# Patient Record
Sex: Female | Born: 1960
Health system: Southern US, Community
[De-identification: ages and names within clinical notes are randomized; demographics above are authoritative.]

## PROBLEM LIST (undated history)

## (undated) DIAGNOSIS — A609 Anogenital herpesviral infection, unspecified: Secondary | ICD-10-CM

## (undated) DIAGNOSIS — M8580 Other specified disorders of bone density and structure, unspecified site: Secondary | ICD-10-CM

## (undated) HISTORY — DX: Other specified disorders of bone density and structure, unspecified site: M85.80

## (undated) HISTORY — PX: TONSILLECTOMY AND ADENOIDECTOMY: SUR1326

## (undated) HISTORY — DX: Anogenital herpesviral infection, unspecified: A60.9

---

## 1999-01-05 ENCOUNTER — Other Ambulatory Visit: Admission: RE | Admit: 1999-01-05 | Discharge: 1999-01-05 | Payer: Self-pay | Admitting: Gynecology

## 2000-06-08 ENCOUNTER — Other Ambulatory Visit: Admission: RE | Admit: 2000-06-08 | Discharge: 2000-06-08 | Payer: Self-pay | Admitting: Gynecology

## 2001-05-30 ENCOUNTER — Encounter: Payer: Self-pay | Admitting: Surgery

## 2001-05-30 ENCOUNTER — Encounter: Admission: RE | Admit: 2001-05-30 | Discharge: 2001-05-30 | Payer: Self-pay | Admitting: Surgery

## 2001-08-18 ENCOUNTER — Other Ambulatory Visit: Admission: RE | Admit: 2001-08-18 | Discharge: 2001-08-18 | Payer: Self-pay | Admitting: Gynecology

## 2003-03-16 ENCOUNTER — Ambulatory Visit (HOSPITAL_COMMUNITY): Admission: RE | Admit: 2003-03-16 | Discharge: 2003-03-16 | Payer: Self-pay | Admitting: Neurology

## 2003-03-17 ENCOUNTER — Encounter: Payer: Self-pay | Admitting: Neurology

## 2003-03-17 ENCOUNTER — Ambulatory Visit (HOSPITAL_COMMUNITY): Admission: RE | Admit: 2003-03-17 | Discharge: 2003-03-17 | Payer: Self-pay | Admitting: Neurology

## 2003-06-06 ENCOUNTER — Encounter: Payer: Self-pay | Admitting: Neurology

## 2003-06-06 ENCOUNTER — Ambulatory Visit (HOSPITAL_COMMUNITY): Admission: RE | Admit: 2003-06-06 | Discharge: 2003-06-06 | Payer: Self-pay | Admitting: Neurology

## 2004-08-18 ENCOUNTER — Other Ambulatory Visit: Admission: RE | Admit: 2004-08-18 | Discharge: 2004-08-18 | Payer: Self-pay | Admitting: Gynecology

## 2005-09-08 ENCOUNTER — Other Ambulatory Visit: Admission: RE | Admit: 2005-09-08 | Discharge: 2005-09-08 | Payer: Self-pay | Admitting: Gynecology

## 2007-09-20 ENCOUNTER — Other Ambulatory Visit: Admission: RE | Admit: 2007-09-20 | Discharge: 2007-09-20 | Payer: Self-pay | Admitting: Gynecology

## 2008-12-10 ENCOUNTER — Ambulatory Visit: Payer: Self-pay | Admitting: Vascular Surgery

## 2012-09-15 ENCOUNTER — Encounter: Payer: Self-pay | Admitting: Gynecology

## 2012-09-25 ENCOUNTER — Encounter: Payer: Self-pay | Admitting: Gynecology

## 2012-09-25 ENCOUNTER — Other Ambulatory Visit (HOSPITAL_COMMUNITY)
Admission: RE | Admit: 2012-09-25 | Discharge: 2012-09-25 | Disposition: A | Discharge status: Home / Self Care | Payer: 59 | Source: Ambulatory Visit | Attending: Gynecology | Admitting: Gynecology

## 2012-09-25 ENCOUNTER — Ambulatory Visit (INDEPENDENT_AMBULATORY_CARE_PROVIDER_SITE_OTHER): Payer: 59 | Admitting: Gynecology

## 2012-09-25 VITALS — BP 112/76 | Ht 69.0 in | Wt 160.0 lb

## 2012-09-25 DIAGNOSIS — Z13220 Encounter for screening for lipoid disorders: Secondary | ICD-10-CM

## 2012-09-25 DIAGNOSIS — N926 Irregular menstruation, unspecified: Secondary | ICD-10-CM

## 2012-09-25 DIAGNOSIS — Z1151 Encounter for screening for human papillomavirus (HPV): Secondary | ICD-10-CM | POA: Insufficient documentation

## 2012-09-25 DIAGNOSIS — Z01419 Encounter for gynecological examination (general) (routine) without abnormal findings: Secondary | ICD-10-CM | POA: Insufficient documentation

## 2012-09-25 NOTE — Patient Instructions (Signed)
  Schedule colonoscopy with Corinda Gubler or [facility]  Call to Schedule your mammogram  Facilities in [place]: 1)  [facility] [place], [place] Phone: [phone] 2)  [facility]. [facility], [phone] Phone: [phone] 3)  Dr. Yolanda Bonine at [facility] [address] Phone: [phone]     Mammogram A mammogram is an X-ray test to find changes in a woman's breast. You should get a mammogram if:  You are 53 years of age or older  You have risk factors.   Your doctor recommends that you have one.  BEFORE THE TEST  Do not schedule the test the week before your period, especially if your breasts are sore during this time.  On the day of your mammogram:  Wash your breasts and armpits well. After washing, do not put on any deodorant or talcum powder on until after your test.   Eat and drink as you usually do.   Take your medicines as usual.   If you are diabetic and take insulin, make sure you:   Eat before coming for your test.   Take your insulin as usual.   If you cannot keep your appointment, call before the appointment to cancel. Schedule another appointment.  TEST  You will need to undress from the waist up. You will put on a hospital gown.   Your breast will be put on the mammogram machine, and it will press firmly on your breast with a piece of plastic called a compression paddle. This will make your breast flatter so that the machine can X-ray all parts of your breast.   Both breasts will be X-rayed. Each breast will be X-rayed from above and from the side. An X-ray might need to be taken again if the picture is not good enough.   The mammogram will last about 15 to 30 minutes.  AFTER THE TEST Finding out the results of your test Ask when your test results will be ready. Make sure you get your test results.  Document Released: 10/29/2008 Document Revised:  07/22/2011 Document Reviewed: 10/29/2008 [facility] Patient Information 2012 [place], [place].

## 2012-09-25 NOTE — Progress Notes (Signed)
.   Donna Kaufman [birth year] [id number]        52 y.o.  [id number] for annual exam.  Has not been in since 2009. Several issues noted below.  Past medical history,surgical history, medications, allergies, family history and social history were all reviewed and documented in the EPIC chart. ROS:  Was performed and pertinent positives and negatives are included in the history.  Exam: Kim assistant Filed Vitals:   09/25/12 1427  BP: 112/76  Height: 5\' 9"  (1.753 m)  Weight: 160 lb (72.576 kg)   General appearance  Normal Skin grossly normal Head/Neck normal with no cervical or supraclavicular adenopathy thyroid normal Lungs  clear Cardiac RR, without RMG Abdominal  soft, nontender, without masses, organomegaly or hernia Breasts  examined lying and sitting without masses, retractions, discharge or axillary adenopathy. Pelvic  Ext/BUS/vagina  normal   Cervix  normal Pap/HPV  Uterus  anteverted, normal size, shape and contour, midline and mobile nontender   Adnexa  Without masses or tenderness    Anus and perineum  normal   Rectovaginal  normal sphincter tone without palpated masses or tenderness.    Assessment/Plan:  52 y.o. [name] female for annual exam, vasectomy birth control.   1. Irregular menses/menopausal symptoms. Patient having some slight hot flashes and night sweats. Also notes irregular menses with LMP 6 months ago. Those of the past year says her become less frequent. No prolonged or atypical bleeding. Feels that her menopausal symptoms are tolerable and does not want to consider treatment. I did review OTC soy. HRT was discussed to include women's health initiative increased risk of stroke heart attack DVT of breast cancer. Patient would prefer just monitoring at present will follow up with me if significant symptoms, goes more than one year without bleeding and then bleeds or as prolonged or atypical bleeding. Otherwise will follow up in one year. 2. Pap smear 2009. Pap/HPV  today. No history of significant abnormal Pap smears.  Assuming this Pap smear normal plan less frequent screening every 3-5 years. 3. Mammography 2009. Patient knows she's overdue and plans to schedule. Names and facilities provided. SBE monthly reviewed. 4. Colonoscopy. Patient's never had a colonoscopy in knows the need to schedule. I gave her the names of several facilities and she will call to arrange. 5. DEXA. We'll plan further into menopause. Increase calcium vitamin D reviewed. 6. Health maintenance. Baseline CBC comprehensive metabolic panel lipid profile TSH vitamin D ordered as a future orders as she will come back fasting at her choice. Follow up one year, sooner as needed.    Dara Lords MD, 3:24 PM 09/25/2012

## 2012-09-26 ENCOUNTER — Other Ambulatory Visit: Payer: 59

## 2012-09-26 LAB — URINALYSIS W MICROSCOPIC + REFLEX CULTURE
Bilirubin Urine: NEGATIVE
Casts: NONE SEEN
Glucose, UA: NEGATIVE mg/dL
Hgb urine dipstick: NEGATIVE
Ketones, ur: NEGATIVE mg/dL
Leukocytes, UA: NEGATIVE
Protein, ur: NEGATIVE mg/dL
pH: 6 (ref 5.0–8.0)

## 2012-10-06 ENCOUNTER — Encounter: Payer: Self-pay | Admitting: Gynecology

## 2012-11-02 ENCOUNTER — Other Ambulatory Visit: Payer: 59

## 2012-11-02 DIAGNOSIS — Z01419 Encounter for gynecological examination (general) (routine) without abnormal findings: Secondary | ICD-10-CM

## 2012-11-02 DIAGNOSIS — Z13220 Encounter for screening for lipoid disorders: Secondary | ICD-10-CM

## 2012-11-02 LAB — COMPREHENSIVE METABOLIC PANEL
AST: 21 U/L (ref 0–37)
Albumin: 4.4 g/dL (ref 3.5–5.2)
Alkaline Phosphatase: 52 U/L (ref 39–117)
Glucose, Bld: 88 mg/dL (ref 70–99)
Potassium: 3.8 mEq/L (ref 3.5–5.3)
Sodium: 140 mEq/L (ref 135–145)
Total Bilirubin: 0.5 mg/dL (ref 0.3–1.2)
Total Protein: 6.4 g/dL (ref 6.0–8.3)

## 2012-11-02 LAB — LIPID PANEL
HDL: 61 mg/dL (ref >39)
LDL Cholesterol: 95 mg/dL (ref 0–99)
Total CHOL/HDL Ratio: 2.7 Ratio
Triglycerides: 45 mg/dL (ref <150)
VLDL: 9 mg/dL (ref 0–40)

## 2012-11-02 LAB — CBC WITH DIFFERENTIAL/PLATELET
Basophils Relative: 1 % (ref 0–1)
Eosinophils Absolute: 0.1 10*3/uL (ref 0.0–0.7)
Eosinophils Relative: 1 % (ref 0–5)
Hemoglobin: 14.3 g/dL (ref 12.0–15.0)
Lymphs Abs: 0.8 10*3/uL (ref 0.7–4.0)
MCH: 29.4 pg (ref 26.0–34.0)
MCHC: 34.9 g/dL (ref 30.0–36.0)
MCV: 84.2 fL (ref 78.0–100.0)
Monocytes Relative: 9 % (ref 3–12)
RBC: 4.87 MIL/uL (ref 3.87–5.11)

## 2013-01-31 ENCOUNTER — Encounter: Payer: Self-pay | Admitting: Gynecology

## 2013-01-31 ENCOUNTER — Ambulatory Visit (INDEPENDENT_AMBULATORY_CARE_PROVIDER_SITE_OTHER): Payer: 59 | Admitting: Gynecology

## 2013-01-31 DIAGNOSIS — B9689 Other specified bacterial agents as the cause of diseases classified elsewhere: Secondary | ICD-10-CM

## 2013-01-31 DIAGNOSIS — N760 Acute vaginitis: Secondary | ICD-10-CM

## 2013-01-31 DIAGNOSIS — N390 Urinary tract infection, site not specified: Secondary | ICD-10-CM

## 2013-01-31 DIAGNOSIS — N898 Other specified noninflammatory disorders of vagina: Secondary | ICD-10-CM

## 2013-01-31 DIAGNOSIS — A499 Bacterial infection, unspecified: Secondary | ICD-10-CM

## 2013-01-31 LAB — URINALYSIS W MICROSCOPIC + REFLEX CULTURE
Bacteria, UA: NONE SEEN
Casts: NONE SEEN
Glucose, UA: NEGATIVE mg/dL
Hgb urine dipstick: NEGATIVE
Ketones, ur: 15 mg/dL — AB
Leukocytes, UA: NEGATIVE
Protein, ur: NEGATIVE mg/dL
RBC / HPF: NONE SEEN RBC/hpf (ref <3)
WBC, UA: NONE SEEN WBC/hpf (ref <3)

## 2013-01-31 LAB — WET PREP FOR TRICH, YEAST, CLUE
Trich, Wet Prep: NONE SEEN
Yeast Wet Prep HPF POC: NONE SEEN

## 2013-01-31 MED ORDER — NITROFURANTOIN MONOHYD MACRO 100 MG PO CAPS: 100.0000 mg | ORAL_CAPSULE | Freq: Once | ORAL | Status: DC

## 2013-01-31 MED ORDER — METRONIDAZOLE 500 MG PO TABS: 500.0000 mg | ORAL_TABLET | Freq: Two times a day (BID) | ORAL | Status: DC

## 2013-01-31 NOTE — Progress Notes (Signed)
Patient presents complaining of one week of vaginal discharge with some irritation. She also notes frequent UTIs following intercourse. Currently not having any symptoms.  Exam with Kim assistant Abdomen soft nontender without masses guarding rebound organomegaly. External BUS vagina with white discharge. Cervix normal. Uterus normal size midline mobile nontender. Adnexa without masses or tenderness.  Assessment and plan: 1. Bacterial vaginosis. Symptoms exam and wet prep consistent with bacterial vaginosis. Treat with Flagyl 500 mg twice a day x7 days, alcohol avoidance reviewed. 2. Frequent post coital UTIs. Will cover with Macrobid 100 mg with intercourse #30 refill x1. Followup if symptoms persist. Check baseline UA today.

## 2013-01-31 NOTE — Patient Instructions (Signed)
Take Flagyl medication twice daily for 7 days, avoid alcohol while taking. Take Macrodantin antibiotic one pill with intercourse to help prevent urinary tract infections.

## 2013-02-17 ENCOUNTER — Telehealth: Payer: Self-pay | Admitting: Gynecology

## 2013-02-17 MED ORDER — CLINDAMYCIN PHOSPHATE 2 % VA CREA: 1.0000 | TOPICAL_CREAM | Freq: Every day | VAGINAL | Status: DC

## 2013-02-17 NOTE — Telephone Encounter (Signed)
On Call note: Evaluated 01/31/2013 for BV.  Used Flagyl but discharge has continued with odor.  No pruritis.  Does note fever to 101 yesterday.  No other symptoms such as UTI, viral URI no other family members sick.  Recommend cleocin vag cream.  If fever continues recommend office visit for evaluation.

## 2013-02-25 ENCOUNTER — Encounter (HOSPITAL_COMMUNITY): Payer: Self-pay | Admitting: Emergency Medicine

## 2013-02-25 ENCOUNTER — Emergency Department (HOSPITAL_COMMUNITY)
Admission: EM | Admit: 2013-02-25 | Discharge: 2013-02-26 | Disposition: A | Discharge status: Home / Self Care | Payer: 59 | Attending: Emergency Medicine | Admitting: Emergency Medicine

## 2013-02-25 ENCOUNTER — Emergency Department (HOSPITAL_COMMUNITY): Payer: 59

## 2013-02-25 DIAGNOSIS — R5381 Other malaise: Secondary | ICD-10-CM | POA: Insufficient documentation

## 2013-02-25 DIAGNOSIS — N898 Other specified noninflammatory disorders of vagina: Secondary | ICD-10-CM | POA: Insufficient documentation

## 2013-02-25 DIAGNOSIS — R6883 Chills (without fever): Secondary | ICD-10-CM | POA: Insufficient documentation

## 2013-02-25 DIAGNOSIS — R059 Cough, unspecified: Secondary | ICD-10-CM | POA: Insufficient documentation

## 2013-02-25 DIAGNOSIS — I959 Hypotension, unspecified: Secondary | ICD-10-CM | POA: Insufficient documentation

## 2013-02-25 DIAGNOSIS — IMO0002 Reserved for concepts with insufficient information to code with codable children: Secondary | ICD-10-CM | POA: Insufficient documentation

## 2013-02-25 DIAGNOSIS — R112 Nausea with vomiting, unspecified: Secondary | ICD-10-CM | POA: Insufficient documentation

## 2013-02-25 DIAGNOSIS — M545 Low back pain, unspecified: Secondary | ICD-10-CM | POA: Insufficient documentation

## 2013-02-25 DIAGNOSIS — R42 Dizziness and giddiness: Secondary | ICD-10-CM

## 2013-02-25 DIAGNOSIS — R51 Headache: Secondary | ICD-10-CM | POA: Insufficient documentation

## 2013-02-25 DIAGNOSIS — R05 Cough: Secondary | ICD-10-CM | POA: Insufficient documentation

## 2013-02-25 DIAGNOSIS — E860 Dehydration: Secondary | ICD-10-CM

## 2013-02-25 DIAGNOSIS — R21 Rash and other nonspecific skin eruption: Secondary | ICD-10-CM | POA: Insufficient documentation

## 2013-02-25 LAB — CBC WITH DIFFERENTIAL/PLATELET
Eosinophils Absolute: 0 10*3/uL (ref 0.0–0.7)
Eosinophils Relative: 0 % (ref 0–5)
Hemoglobin: 14 g/dL (ref 12.0–15.0)
Lymphocytes Relative: 1 % — ABNORMAL LOW (ref 12–46)
Lymphs Abs: 0.1 10*3/uL — ABNORMAL LOW (ref 0.7–4.0)
MCH: 29.8 pg (ref 26.0–34.0)
MCV: 88.3 fL (ref 78.0–100.0)
Monocytes Relative: 1 % — ABNORMAL LOW (ref 3–12)
Neutrophils Relative %: 99 % — ABNORMAL HIGH (ref 43–77)
Platelets: 203 10*3/uL (ref 150–400)
RBC: 4.7 MIL/uL (ref 3.87–5.11)
WBC: 8.3 10*3/uL (ref 4.0–10.5)

## 2013-02-25 MED ORDER — SODIUM CHLORIDE 0.9 % IV BOLUS (SEPSIS)
1000.0000 mL | Freq: Once | INTRAVENOUS | Status: AC
Start: 2013-02-25 — End: 2013-02-26
  Administered 2013-02-25: 1000 mL via INTRAVENOUS

## 2013-02-25 MED ORDER — ONDANSETRON HCL 4 MG/2ML IJ SOLN
4.0000 mg | Freq: Once | INTRAMUSCULAR | Status: AC
  Administered 2013-02-25: 4 mg via INTRAVENOUS
  Filled 2013-02-25: qty 2

## 2013-02-25 MED ORDER — LACTATED RINGERS IV BOLUS (SEPSIS)
2000.0000 mL | Freq: Once | INTRAVENOUS | Status: AC
  Administered 2013-02-26: 2000 mL via INTRAVENOUS

## 2013-02-25 NOTE — ED Notes (Signed)
Patient reports that about a week ago she started to feel chilled and having dizziness with vomiting. The patient also reports that she has aches in her back adn neck. The patient reports that it went away and now it is back. The patient is pale

## 2013-02-25 NOTE — ED Provider Notes (Signed)
History    CSN: [id number] Arrival date & time 02/25/13  2114  First MD Initiated Contact with Patient 02/25/13 2304     Chief Complaint  Patient presents with  . Dizziness  . Chills   HPI Donna Kaufman is a 52 y.o. female presents with dizziness, nausea vomiting does have some associated back pain today. Patient says she was driving home from a vacation house in [place] at about 1800 she developed a backache started at the lower back on both sides, not in the midline, she felt nauseous, took some ibuprofen but vomited it up. It had a couple more episodes of vomiting. She also got a dull headache which she says felt like she was dehydrated in the past.   Patient was recently in [place] had a vacation home for a family reunion, [place], and says she did go swimming there on Thursday or Friday and also probably some urged her head.  1011 days ago, she had some similar symptoms while at the [facility] of generalized malaise, chills, subjective fever, shaking chills and fatigue. The symptoms did resolve the course of the week.  Patient does not smoke, she says she's been drinking during her vacation including vodka, wine and margaritas.  Patient complains about bilateral knee pain which is been worse going upstairs, does not hurt with extended periods of sitting, does not hurt currently. Patient also complains about a week ago she noticed a rash on her lower extremities this was nonpruritic, she's not sure that was blanching, it did not seem raised she did not touch it.  Currently the patient is complaining about a dull headache, she says she feels thirsty but her other symptoms of dizziness, malaise, back pain and neck pain are gone.  Also on review of systems patient complains about vaginal discharge and odor, 2 weeks ago she complained about this to primary care doctor, was given course of antibiotics that she was not to take with alcohol, she completed it on or about 6/21, symptoms  persisted, she used a topical antibiotic preparation which cleared up her symptoms. Patient also noticed that last Friday she had a cough for a few days but that went away. She also says that she's got a history of minor low back issues as well some chronic neck issues. No injuries and no surgical history to back or neck.  History reviewed. No pertinent past medical history. Past Surgical History  Procedure Laterality Date  . Cesarean section    . Tonsillectomy and adenoidectomy     Family History  Problem Relation Age of Onset  . Hypertension Father   . Diabetes Father   . Cancer Father     Prostate  . Cancer Brother     Prostate   History  Substance Use Topics  . Smoking status: Never Smoker   . Smokeless tobacco: Not on file  . Alcohol Use: 2.0 oz/week    4 drink(s) per week   OB History   Grav Para Term Preterm Abortions TAB SAB Ect Mult Living   3 2 2  1  1   2      Review of Systems At least 10pt or greater review of systems completed and are negative except where specified in the HPI.  Allergies  Review of patient's allergies indicates no known allergies.  Home Medications   Current Outpatient Rx  Name  Route  Sig  Dispense  Refill  . clindamycin (CLEOCIN) 2 % vaginal cream   Vaginal  Place 1 Applicatorful vaginally at bedtime.   40 g   0   . Fexofenadine HCl (ALLEGRA PO)   Oral   Take by mouth.         . fluticasone (VERAMYST) 27.5 MCG/SPRAY nasal spray   Nasal   Place 2 sprays into the nose daily.         . metroNIDAZOLE (FLAGYL) 500 MG tablet   Oral   Take 1 tablet (500 mg total) by mouth 2 (two) times daily. For 7 days.  Avoid alcohol while taking   14 tablet   0   . nitrofurantoin, macrocrystal-monohydrate, (MACROBID) 100 MG capsule   Oral   Take 1 capsule (100 mg total) by mouth once. After intercourse   30 capsule   1    BP 95/37  Pulse 81  Temp(Src) 99.2 F (37.3 C) (Oral)  Resp 15  Ht 5\' 9"  (1.753 m)  Wt 140 lb (63.504 kg)   BMI 20.67 kg/m2  SpO2 100% Physical Exam  PHYSICAL EXAM: VITAL SIGNS:  . Filed Vitals:   02/25/13 2225 02/25/13 2234 02/26/13 0019  BP: 74/42 95/37   Pulse: 81 81   Temp: 99.3 F (37.4 C) 99.2 F (37.3 C) 99.3 F (37.4 C)  TempSrc: Oral Oral Rectal  Resp: 15    Height: 5\' 9"  (1.753 m)    Weight: 140 lb (63.504 kg)    SpO2: 99% 100%    CONSTITUTIONAL: Awake, oriented x4, appears non-toxic HENT: Atraumatic, normocephalic, oral mucosa pink and moist, airway patent. Nares patent without drainage. External ears normal. EYES: Conjunctiva clear, EOMI, PERRLA NECK: Trachea midline, non-tender, supple. Patient able to rotate head and neck, fully flex and extend head and neck without any pain or discomfort. CARDIOVASCULAR: Normal heart rate, Normal rhythm, No murmurs, rubs, gallops PULMONARY/CHEST: Fine crackles in the base of the right lung on expiration, no wheezes or rhonchi. Symmetrical breath sounds. CHEST WALL: No lesions. Non-tender. ABDOMINAL: Non-distended, soft, non-tender - no rebound or guarding.  BS normal. NEUROLOGIC: ZO:XWRUEA fields intact. PERRLA, EOMI.  Facial sensation equal to light touch bilaterally.  Good muscle bulk in the masseter muscle and good lateral movement of the jaw.  Facial expressions equal and good strength with smile/frown and puffed cheeks.  Hearing grossly intact to finger rub test.  Uvula, tongue are midline with no deviation. Symmetrical palate elevation.  Trapezius and SCM muscles are 5/5 strength bilaterally.   DTR: Brachioradialis, biceps, patellar, Achilles tendon reflexes 2+ bilaterally.  No clonus. Strength: 5/5 strength flexors and extensors in the upper and lower extremities.  Grip strength, finger adduction/abduction 5/5. Sensation: Sensation intact distally to light touch Cerebellar: No ataxia with walking or dysmetria with finger to nose, rapid alternating hand movements and heels to shin testing. EXTREMITIES: No clubbing, cyanosis, or  edema SKIN: Warm, Dry, No erythema, patches of mildly erythematous, blanchable skin on the lower extremities, these are not pruritic, no excoriations, no drainage.   ED Course  Procedures (including critical care time) Labs Reviewed  COMPREHENSIVE METABOLIC PANEL - Abnormal; Notable for the following:    Glucose, Bld 103 (*)    AST 154 (*)    ALT 80 (*)    All other components within normal limits  CBC WITH DIFFERENTIAL - Abnormal; Notable for the following:    Neutrophils Relative % 99 (*)    Neutro Abs 8.2 (*)    Lymphocytes Relative 1 (*)    Lymphs Abs 0.1 (*)    Monocytes Relative 1 (*)  All other components within normal limits  URINALYSIS, ROUTINE W REFLEX MICROSCOPIC - Abnormal; Notable for the following:    Color, Urine AMBER (*)    APPearance CLOUDY (*)    Bilirubin Urine SMALL (*)    Ketones, ur >80 (*)    Leukocytes, UA MODERATE (*)    All other components within normal limits  POCT I-STAT, CHEM 8 - Abnormal; Notable for the following:    Glucose, Bld 112 (*)    All other components within normal limits  URINE CULTURE  URINE MICROSCOPIC-ADD ON   Dg Chest 2 View  02/26/2013   *RADIOLOGY REPORT*  Clinical Data: Cough, fever and crackles; hypotension.  CHEST - 2 VIEW  Comparison: None.  Findings: The lungs are well-aerated and clear.  There is no evidence of focal opacification, pleural effusion or pneumothorax.  The heart is normal in size; the mediastinal contour is within normal limits.  No acute osseous abnormalities are seen.  IMPRESSION: No acute cardiopulmonary process seen.   Original Report Authenticated By: Tonia Ghent, M.D.   1. Moderate dehydration   2. Dizzy   3. Hypotension   4. Headache    Medications  ondansetron (ZOFRAN) injection 4 mg (4 mg Intravenous Given 02/25/13 2257)  sodium chloride 0.9 % bolus 1,000 mL (0 mLs Intravenous Stopped 02/26/13 0009)  lactated ringers bolus 2,000 mL (0 mLs Intravenous Stopped 02/26/13 0203)    MDM  Donna Kaufman is a 52 y.o. female presenting with nausea, chills, dizziness and vomiting. Patient has been vacationing and according to her, doing a bit more drinking and usual, she's been orthostatic by history, patient does typically have low blood pressure, however blood pressure is a bit lower than normal today. Based on the patient's clinical history, physical exam, she is nontoxic, she is afebrile, rectal temperature is unremarkable, I do not think she's got a serious infection at this time. She has been vacationing and a leg, she did submerge her head, I do not think she's got meningitis or encephalitis-did consider infection with Naeglaria etc, but the patient's benign presentation makes this less likely. Labs are significant for some dehydration, she's been given fluids, and feels much better. She is not nauseous anymore, she is not orthostatic, I think she is likely been dehydrated secondary to increased alcohol intake, at this point I do not think lumbar puncture is indicated. Do not think this patient needs antibiotics- patient has no symptoms of urinary tract infection, urine is concentrated, contaminated with squamous epithelial cells, will be cultured for followup. Increase of AST and ALT and about 21 ratio suggestive of alcohol intake, abdomen is benign on exam, do not suspect surgical emergency, do not think imaging is indicated.  Treat patient symptomatically, patient to followup with primary care. I explained the diagnosis and have given explicit precautions to return to the ER including any other new or worsening symptoms. The patient understands and accepts the medical plan as it's been dictated and I have answered their questions. Discharge instructions concerning home care and prescriptions have been given.  The patient is STABLE and is discharged to home in good condition.    Jones Skene, MD 03/01/13 2245

## 2013-02-26 LAB — URINALYSIS, ROUTINE W REFLEX MICROSCOPIC
Glucose, UA: NEGATIVE mg/dL
Hgb urine dipstick: NEGATIVE
Protein, ur: NEGATIVE mg/dL
pH: 6 (ref 5.0–8.0)

## 2013-02-26 LAB — COMPREHENSIVE METABOLIC PANEL
AST: 154 U/L — ABNORMAL HIGH (ref 0–37)
Albumin: 3.7 g/dL (ref 3.5–5.2)
BUN: 20 mg/dL (ref 6–23)
Calcium: 9.5 mg/dL (ref 8.4–10.5)
Creatinine, Ser: 0.77 mg/dL (ref 0.50–1.10)
Total Protein: 6.4 g/dL (ref 6.0–8.3)

## 2013-02-26 LAB — POCT I-STAT, CHEM 8
BUN: 18 mg/dL (ref 6–23)
Chloride: 103 mEq/L (ref 96–112)
Creatinine, Ser: 0.9 mg/dL (ref 0.50–1.10)
Glucose, Bld: 112 mg/dL — ABNORMAL HIGH (ref 70–99)
Potassium: 3.6 mEq/L (ref 3.5–5.1)
Sodium: 141 mEq/L (ref 135–145)

## 2013-02-26 LAB — URINE MICROSCOPIC-ADD ON

## 2013-02-26 MED ORDER — ONDANSETRON 4 MG PO TBDP: 4.0000 mg | ORAL_TABLET | Freq: Four times a day (QID) | ORAL | Status: DC | PRN

## 2013-02-26 NOTE — ED Notes (Signed)
EDP aware of hypotension, no new orders at this time.  MD will reassess pt

## 2013-02-27 LAB — URINE CULTURE

## 2013-04-10 ENCOUNTER — Other Ambulatory Visit: Payer: Self-pay | Admitting: Family Medicine

## 2013-04-13 ENCOUNTER — Other Ambulatory Visit: Payer: Self-pay | Admitting: Family Medicine

## 2013-04-13 ENCOUNTER — Ambulatory Visit
Admission: RE | Admit: 2013-04-13 | Discharge: 2013-04-13 | Disposition: A | Discharge status: Home / Self Care | Payer: 59 | Source: Ambulatory Visit | Attending: Family Medicine | Admitting: Family Medicine

## 2013-05-14 ENCOUNTER — Telehealth: Payer: Self-pay | Admitting: *Deleted

## 2013-05-14 NOTE — Telephone Encounter (Signed)
Sounds more of a side effect/reaction than a true allergy such as rash or respiratory symptoms. Regardless I would stop Macrobid and switched to ciprofloxacin 250 mg 1 pill with intercourse to see if this doesn't prevent recurrent UTIs. #30 no refill

## 2013-05-14 NOTE — Telephone Encounter (Signed)
Pt was given Rx for Macrobid 100 mg with intercourse on 01/31/13, pt believes she may be allergic to medication. Pt c/o aching, low grade fever, headaches only when taking medication. Please advise

## 2013-05-15 MED ORDER — CIPROFLOXACIN HCL 250 MG PO TABS: ORAL_TABLET | ORAL | Status: DC

## 2013-05-15 NOTE — Telephone Encounter (Signed)
Pt informed, rx sent 

## 2013-05-15 NOTE — Telephone Encounter (Signed)
Left message for pt to call.

## 2013-06-25 ENCOUNTER — Encounter: Payer: Self-pay | Admitting: Gynecology

## 2013-06-25 ENCOUNTER — Ambulatory Visit (INDEPENDENT_AMBULATORY_CARE_PROVIDER_SITE_OTHER): Payer: 59 | Admitting: Gynecology

## 2013-06-25 DIAGNOSIS — R300 Dysuria: Secondary | ICD-10-CM

## 2013-06-25 DIAGNOSIS — N390 Urinary tract infection, site not specified: Secondary | ICD-10-CM

## 2013-06-25 LAB — URINALYSIS W MICROSCOPIC + REFLEX CULTURE
Bilirubin Urine: NEGATIVE
Casts: NONE SEEN
Crystals: NONE SEEN
Glucose, UA: NEGATIVE mg/dL
Ketones, ur: NEGATIVE mg/dL
Specific Gravity, Urine: 1.02 (ref 1.005–1.030)
Urobilinogen, UA: 0.2 mg/dL (ref 0.0–1.0)
pH: 5.5 (ref 5.0–8.0)

## 2013-06-25 MED ORDER — CIPROFLOXACIN HCL 250 MG PO TABS: 250.0000 mg | ORAL_TABLET | Freq: Two times a day (BID) | ORAL | Status: DC

## 2013-06-25 NOTE — Progress Notes (Signed)
Several day history of painful urination. No significant frequency, urgency low back pain fever chills. Had been using Macrobid prophylactically postcoitally but had reaction to include fevers myalgias arthralgias. Had extensive workup to include Lyme's disease all of which was negative and was felt as a reaction to medication.  Exam Spine straight without CVA tenderness. Abdomen soft nontender without masses guarding rebound organomegaly. Urinalysis with TNTC wbc's many bacteria a few squamous cells.  Assessment and plan: UTI by symptoms and urinalysis. Treat with ciprofloxacin 250 mg twice a day x7 days. Possible post coital treatment with ciprofloxacin discussed but declined by the patient. She really preferred just to monitor at present and to see how frequent she has any recurrences. She would prefer to do a short treatment course if UTIs occur less frequently. Had been seen by urology in the past evaluation. She will call if her symptoms persist worsen or recur.

## 2013-06-25 NOTE — Patient Instructions (Signed)
Take ciprofloxacin antibiotic twice daily for 7 days. Call me if your symptoms persist, worsen or recur.

## 2013-09-03 ENCOUNTER — Telehealth: Payer: Self-pay | Admitting: *Deleted

## 2013-09-03 ENCOUNTER — Ambulatory Visit: Payer: 59 | Admitting: Gynecology

## 2013-09-03 MED ORDER — SULFAMETHOXAZOLE-TMP DS 800-160 MG PO TABS: 1.0000 | ORAL_TABLET | Freq: Two times a day (BID) | ORAL | Status: DC

## 2013-09-03 NOTE — Telephone Encounter (Signed)
Pt calling c/o UTI painful urination, last seen on 06/25/13 treated for UTI, pt said she was told if symptoms should occur again to call for Rx? Please advise

## 2013-09-03 NOTE — Telephone Encounter (Signed)
Pt informed with the below note, rx sent. 

## 2013-09-03 NOTE — Telephone Encounter (Signed)
Patient asked if she could stick to the same medication given last time Cipro 250 mg given back in Nov. Only because she is nervous about taking new medication due to Macrobid sickness last time. She also said that you told her she may need a Rx to keep on hand due to recurrent UTI? Please advise

## 2013-09-03 NOTE — Telephone Encounter (Signed)
The reason I pick a different antibiotic is because of the relative short recurrence time frame I wanted to take a different type of antibiotic to see if this doesn't eradicate the bacteria completely. So I would suggest trying the Septra and then we'll see what happens if she does have another recurrence relatively soon we may want to talk about prophylactic antibiotics after intercourse.

## 2013-09-03 NOTE — Telephone Encounter (Signed)
Recommend Septra DS 1 by mouth twice a day x7 days

## 2013-09-21 ENCOUNTER — Telehealth: Payer: Self-pay | Admitting: *Deleted

## 2013-09-21 MED ORDER — PHENAZOPYRIDINE HCL 200 MG PO TABS: 200.0000 mg | ORAL_TABLET | Freq: Three times a day (TID) | ORAL | Status: DC | PRN

## 2013-09-21 MED ORDER — SULFAMETHOXAZOLE-TRIMETHOPRIM 800-160 MG PO TABS: 1.0000 | ORAL_TABLET | Freq: Two times a day (BID) | ORAL | Status: DC

## 2013-09-21 NOTE — Telephone Encounter (Signed)
Septra DS one by mouth twice a day x5 days. Pyridium 200 mg 3 times a day as needed for discomfort #10

## 2013-09-21 NOTE — Telephone Encounter (Signed)
Pt is on her way to [place] and c/o UTI symptoms, pt aware do to the time of evening rx may not be given. I placed the pharmacy in system if you would approve Rx.  Please advise

## 2013-10-15 ENCOUNTER — Encounter: Payer: Self-pay | Admitting: *Deleted

## 2013-10-16 ENCOUNTER — Ambulatory Visit (INDEPENDENT_AMBULATORY_CARE_PROVIDER_SITE_OTHER): Payer: Self-pay | Admitting: *Deleted

## 2013-10-16 DIAGNOSIS — I781 Nevus, non-neoplastic: Secondary | ICD-10-CM

## 2013-10-16 NOTE — Progress Notes (Signed)
X=.3% Sotradecol administered with a 27g butterfly.  Patient received a total of 3cc.  Cutaneous Laser:pulsed mode  810 j/cm2 delay 13ms duration 0.5 spot   Total pulses: 1226 Total energy 1.945  Total time::15  Photos: yes  Compression stockings applied: yes (Med)  Treated small spiders with a combo of sclero and CL. Tol well. Anticipate good results. Follow prn.

## 2013-10-17 ENCOUNTER — Encounter: Payer: Self-pay | Admitting: Vascular Surgery

## 2013-10-17 ENCOUNTER — Ambulatory Visit: Payer: 59 | Admitting: *Deleted

## 2013-10-18 ENCOUNTER — Encounter: Payer: Self-pay | Admitting: Gynecology

## 2013-11-19 ENCOUNTER — Ambulatory Visit (INDEPENDENT_AMBULATORY_CARE_PROVIDER_SITE_OTHER): Payer: 59 | Admitting: Gynecology

## 2013-11-19 ENCOUNTER — Telehealth: Payer: Self-pay | Admitting: Obstetrics & Gynecology

## 2013-11-19 ENCOUNTER — Encounter: Payer: Self-pay | Admitting: Gynecology

## 2013-11-19 DIAGNOSIS — N390 Urinary tract infection, site not specified: Secondary | ICD-10-CM

## 2013-11-19 DIAGNOSIS — N898 Other specified noninflammatory disorders of vagina: Secondary | ICD-10-CM

## 2013-11-19 LAB — WET PREP FOR TRICH, YEAST, CLUE
CLUE CELLS WET PREP: NONE SEEN
Trich, Wet Prep: NONE SEEN
YEAST WET PREP: NONE SEEN

## 2013-11-19 MED ORDER — CIPROFLOXACIN HCL 250 MG PO TABS: 250.0000 mg | ORAL_TABLET | Freq: Once | ORAL | Status: DC

## 2013-11-19 MED ORDER — FLUCONAZOLE 200 MG PO TABS: 200.0000 mg | ORAL_TABLET | Freq: Every day | ORAL | Status: DC

## 2013-11-19 MED ORDER — CLINDAMYCIN PHOSPHATE 2 % VA CREA: 1.0000 | TOPICAL_CREAM | Freq: Every day | VAGINAL | Status: DC

## 2013-11-19 NOTE — Patient Instructions (Signed)
Use the vaginal cream nightly for 7 days. Take the Diflucan pill daily for 3 days Use the ciprofloxacin antibiotic after intercourse Followup if any persistent or recurrent issues.

## 2013-11-19 NOTE — Telephone Encounter (Signed)
This is a late entry into EPIC from an on-call phone call from yesterday 11/18/13.  Pt called around 10:30am while I was at church so I did not have access to EPIC at that time.  Pt called reporting vaginal discharge and irritation that has been present off and on for several weeks.  She reports going into the office several times for this complaint and has been treated several different times.  (Review of EPIC shows she has not seen Dr. Audie BoxFontaine for several months.)  Pt in monogamous relationship.  Describes vaginal discharge and irritation as "dry".  No itching.  No found odor.  Discharge is not copious.  No STD exposure.  No UTI symptoms.  No fever.  No pelvic pain.  Admits she is having some painful intercourse.  Pt could not tell me anything she has been recently treated with except for Cipro for a UTI.  Pt has been menopausal for several years.  Not on HRT.  I feel this is most likely menopausal related and needs OV.  Advised of OTC Replens vaginal moisturizer.  Do not feel on-call phone is appropriate for starting any hormonal therapy.  Pt voiced understanding.  Have spoken with [facility] front office to ask pt be called for appointment.

## 2013-11-19 NOTE — Progress Notes (Signed)
Donna Kaufman 11/04/1960 [id number]        53 y.o.  G3P2012 one-week history of vaginal discharge with irritation. Was treated for bacterial vaginosis June 2014 initially with Flagyl and then Cleocin vaginal cream. Also having some issues with more frequent UTIs felt to be post coital. Tried Macrobid postcoital but had an allergic reaction to include fever  Past medical history,surgical history, problem list, medications, allergies, family history and social history were all reviewed and documented in the EPIC chart.  Exam: Kim assistant General appearance  Normal Spine straight without CVA tenderness Abdomen soft nontender without masses guarding rebound organomegaly. Pelvic external BUS vagina with thick yellow discharge. Cervix normal. Uterus normal sized mobile nontender. Adnexa without masses or tenderness.  Assessment/Plan:  53 y.o. [id number] vaginal discharge with irritation. Exam and wet prep consistent with bacterial vaginosis. Will treat with Cleocin vaginal cream nightly x7 days. Given the thickness of the discharge difficulty assessing for yeast. Going to cover her with Diflucan 200 mg nightly x3 days. We also discussed her post coital UTIs. Going to try ciprofloxacin 250 mg after intercourse. #30 provided no refill. Will call me at the end of the see how she's doing. She does have her annual exam scheduled one month from today and she'll followup for this.   Note: This document was prepared with digital dictation and possible smart phrase technology. Any transcriptional errors that result from this process are unintentional.   Dara LordsFONTAINE,Corin Tilly P MD, 4:02 PM 11/19/2013

## 2013-11-20 LAB — URINALYSIS W MICROSCOPIC + REFLEX CULTURE
BILIRUBIN URINE: NEGATIVE
CASTS: NONE SEEN
Crystals: NONE SEEN
Glucose, UA: NEGATIVE mg/dL
HGB URINE DIPSTICK: NEGATIVE
KETONES UR: NEGATIVE mg/dL
Leukocytes, UA: NEGATIVE
NITRITE: NEGATIVE
PH: 5.5 (ref 5.0–8.0)
Protein, ur: NEGATIVE mg/dL
Specific Gravity, Urine: 1.023 (ref 1.005–1.030)
UROBILINOGEN UA: 0.2 mg/dL (ref 0.0–1.0)

## 2013-11-23 ENCOUNTER — Telehealth: Payer: Self-pay | Admitting: *Deleted

## 2013-11-23 MED ORDER — CIPROFLOXACIN HCL 250 MG PO TABS: 250.0000 mg | ORAL_TABLET | Freq: Two times a day (BID) | ORAL | Status: DC

## 2013-11-23 NOTE — Telephone Encounter (Signed)
Call in Cipro 250 mg BID x 3 days

## 2013-11-23 NOTE — Telephone Encounter (Signed)
(  TF patient) pt was seen on 11/19/13 for vaginal discharge with irritation. Pt said today she noticed some frequent urine, no burning, pt has cipro 250 mg after sex. Pt asked if she could have additional Rx for Cipro for UTI? Please advise

## 2013-11-23 NOTE — Telephone Encounter (Signed)
Pt informed, rx sent 

## 2013-12-20 ENCOUNTER — Encounter: Payer: Self-pay | Admitting: Gynecology

## 2013-12-20 ENCOUNTER — Ambulatory Visit (INDEPENDENT_AMBULATORY_CARE_PROVIDER_SITE_OTHER): Payer: 59 | Admitting: Gynecology

## 2013-12-20 VITALS — BP 120/76 | Ht 69.0 in | Wt 144.0 lb

## 2013-12-20 DIAGNOSIS — Z01419 Encounter for gynecological examination (general) (routine) without abnormal findings: Secondary | ICD-10-CM

## 2013-12-20 DIAGNOSIS — N952 Postmenopausal atrophic vaginitis: Secondary | ICD-10-CM

## 2013-12-20 MED ORDER — ESTRADIOL 10 MCG VA TABS: ORAL_TABLET | VAGINAL | Status: DC

## 2013-12-20 NOTE — Progress Notes (Signed)
Trude McburneyDiane W Sigmund 05/06/1961 [id number]        53 y.o.  [id number] for annual exam.  Doing well. Several issues noted below.  Past medical history,surgical history, problem list, medications, allergies, family history and social history were all reviewed and documented as reviewed in the EPIC chart.  ROS:  12 system ROS performed with pertinent positives and negatives included in the history, assessment and plan.  Included Systems: General, HEENT, Neck, Cardiovascular, Pulmonary, Gastrointestinal, Genitourinary, Musculoskeletal, Dermatologic, Endocrine, Hematological, Neurologic, Psychiatric Additional significant findings :  Vaginal dryness with intercourse discussed below   Exam: Kim assistant Filed Vitals:   12/20/13 1056  BP: 120/76  Height: 5\' 9"  (1.753 m)  Weight: 144 lb (65.318 kg)   General appearance:  Normal affect, orientation and appearance. Skin: Grossly normal HEENT: Without gross lesions.  No cervical or supraclavicular adenopathy. Thyroid normal.  Lungs:  Clear without wheezing, rales or rhonchi Cardiac: RR, without RMG Abdominal:  Soft, nontender, without masses, guarding, rebound, organomegaly or hernia Breasts:  Examined lying and sitting without masses, retractions, discharge or axillary adenopathy. Pelvic:  Ext/BUS/vagina generalized atrophic changes  Cervix normal  Uterus anteverted, normal size, shape and contour, midline and mobile nontender   Adnexa  Without masses or tenderness    Anus and perineum  Normal   Rectovaginal  Normal sphincter tone without palpated masses or tenderness.    Assessment/Plan:  53 y.o. [id number] female for annual exam.   1. Postmenopausal/atrophic genital changes/dyspareunia. LMP 06/2012. No vaginal bleeding since. Is having some hot flashes and night sweats which overall are tolerable. No daily vaginal dryness but is having dyspareunia requiring lubricants. Not totally satisfied with this. Reviewed options to include vaginal estrogen cream,  Vagifem and Osphena. The pros/cons, risks/benefits of each reviewed. Patient wants a trial of Vagifem. Her prescription and written and provided to her for nightly x14 nights then twice weekly. Patient will followup with me if any issues. Also knows to call if any vaginal bleeding. 2. Pap smear/HPV negative 2014. No Pap smear done today. Plan repeat Pap smear in 3 year interval. 3. Mammography 10/2013. Continue with annual mammography. SBE monthly review. 4. DEXA never. We'll plan further into the menopause. Increase calcium vitamin D reviewed. 5. Colonoscopy never. Strongly recommended she schedule screening colonoscopy and she agrees to do so. Names and numbers provided. 6. Health maintenance. Patient requests baseline labs but is not fasting and will come back for these. Future orders put in for CBC comprehensive metabolic panel lipid profile urinalysis TSH and vitamin D level. Followup in one year, sooner as needed   Note: This document was prepared with digital dictation and possible smart phrase technology. Any transcriptional errors that result from this process are unintentional.   Dara Lordsimothy P Kailea Dannemiller MD, 11:39 AM 12/20/2013

## 2013-12-20 NOTE — Patient Instructions (Addendum)
Try the Vagifem as we discussed. Call me if you have any issues with this. Call if you do any vaginal bleeding. Followup for fasting blood work Followup in one year for annual exam. Schedule colonoscopy with [facility] at [phone] or [facility] at [phone]  You may obtain a copy of any labs that were done today by logging onto MyChart as outlined in the instructions provided with your AVS (after visit summary). The office will not call with normal lab results but certainly if there are any significant abnormalities then we will contact you.   Health Maintenance, Female A healthy lifestyle and preventative care can promote health and wellness.  Maintain regular health, dental, and eye exams.  Eat a healthy diet. Foods like vegetables, fruits, whole grains, low-fat dairy products, and lean protein foods contain the nutrients you need without too many calories. Decrease your intake of foods high in solid fats, added sugars, and salt. Get information about a proper diet from your caregiver, if necessary.  Regular physical exercise is one of the most important things you can do for your health. Most adults should get at least 150 minutes of moderate-intensity exercise (any activity that increases your heart rate and causes you to sweat) each week. In addition, most adults need muscle-strengthening exercises on 2 or more days a week.   Maintain a healthy weight. The body mass index (BMI) is a screening tool to identify possible weight problems. It provides an estimate of body fat based on height and weight. Your caregiver can help determine your BMI, and can help you achieve or maintain a healthy weight. For adults 20 years and older:  A BMI below 18.5 is considered underweight.  A BMI of 18.5 to 24.9 is normal.  A BMI of 25 to 29.9 is considered overweight.  A BMI of 30 and above is considered obese.  Maintain normal blood lipids and cholesterol by exercising  and minimizing your intake of saturated fat. Eat a balanced diet with plenty of fruits and vegetables. Blood tests for lipids and cholesterol should begin at age 5 and be repeated every 5 years. If your lipid or cholesterol levels are high, you are over 50, or you are a high risk for heart disease, you may need your cholesterol levels checked more frequently.Ongoing high lipid and cholesterol levels should be treated with medicines if diet and exercise are not effective.  If you smoke, find out from your caregiver how to quit. If you do not use tobacco, do not start.  Lung cancer screening is recommended for adults aged 41 80 years who are at high risk for developing lung cancer because of a history of smoking. Yearly low-dose computed tomography (CT) is recommended for people who have at least a 30-pack-year history of smoking and are a current smoker or have quit within the past 15 years. A pack year of smoking is smoking an average of 1 pack of cigarettes a day for 1 year (for example: 1 pack a day for 30 years or 2 packs a day for 15 years). Yearly screening should continue until the smoker has stopped smoking for at least 15 years. Yearly screening should also be stopped for people who develop a health problem that would prevent them from having lung cancer treatment.  If you are pregnant, do not drink alcohol. If you are breastfeeding, be very cautious about drinking alcohol. If you are not pregnant and choose to drink alcohol, do not exceed 1 drink per day. One  drink is considered to be 12 ounces (355 mL) of beer, 5 ounces (148 mL) of wine, or 1.5 ounces (44 mL) of liquor.  Avoid use of street drugs. Do not share needles with anyone. Ask for help if you need support or instructions about stopping the use of drugs.  High blood pressure causes heart disease and increases the risk of stroke. Blood pressure should be checked at least every 1 to 2 years. Ongoing high blood pressure should be treated  with medicines, if weight loss and exercise are not effective.  If you are 36 to 54 years old, ask your caregiver if you should take aspirin to prevent strokes.  Diabetes screening involves taking a blood sample to check your fasting blood sugar level. This should be done once every 3 years, after age 21, if you are within normal weight and without risk factors for diabetes. Testing should be considered at a younger age or be carried out more frequently if you are overweight and have at least 1 risk factor for diabetes.  Breast cancer screening is essential preventative care for women. You should practice "breast self-awareness." This means understanding the normal appearance and feel of your breasts and may include breast self-examination. Any changes detected, no matter how small, should be reported to a caregiver. Women in their 51s and 30s should have a clinical breast exam (CBE) by a caregiver as part of a regular health exam every 1 to 3 years. After age 22, women should have a CBE every year. Starting at age 51, women should consider having a mammogram (breast X-ray) every year. Women who have a family history of breast cancer should talk to their caregiver about genetic screening. Women at a high risk of breast cancer should talk to their caregiver about having an MRI and a mammogram every year.  Breast cancer gene (BRCA)-related cancer risk assessment is recommended for women who have family members with BRCA-related cancers. BRCA-related cancers include breast, ovarian, tubal, and peritoneal cancers. Having family members with these cancers may be associated with an increased risk for harmful changes (mutations) in the breast cancer genes BRCA1 and BRCA2. Results of the assessment will determine the need for genetic counseling and BRCA1 and BRCA2 testing.  The Pap test is a screening test for cervical cancer. Women should have a Pap test starting at age 68. Between ages 26 and 56, Pap tests  should be repeated every 2 years. Beginning at age 40, you should have a Pap test every 3 years as long as the past 3 Pap tests have been normal. If you had a hysterectomy for a problem that was not cancer or a condition that could lead to cancer, then you no longer need Pap tests. If you are between ages 38 and 53, and you have had normal Pap tests going back 10 years, you no longer need Pap tests. If you have had past treatment for cervical cancer or a condition that could lead to cancer, you need Pap tests and screening for cancer for at least 20 years after your treatment. If Pap tests have been discontinued, risk factors (such as a new sexual partner) need to be reassessed to determine if screening should be resumed. Some women have medical problems that increase the chance of getting cervical cancer. In these cases, your caregiver may recommend more frequent screening and Pap tests.  The human papillomavirus (HPV) test is an additional test that may be used for cervical cancer screening. The HPV test looks  for the virus that can cause the cell changes on the cervix. The cells collected during the Pap test can be tested for HPV. The HPV test could be used to screen women aged 32 years and older, and should be used in women of any age who have unclear Pap test results. After the age of 60, women should have HPV testing at the same frequency as a Pap test.  Colorectal cancer can be detected and often prevented. Most routine colorectal cancer screening begins at the age of 37 and continues through age 87. However, your caregiver may recommend screening at an earlier age if you have risk factors for colon cancer. On a yearly basis, your caregiver may provide home test kits to check for hidden blood in the stool. Use of a small camera at the end of a tube, to directly examine the colon (sigmoidoscopy or colonoscopy), can detect the earliest forms of colorectal cancer. Talk to your caregiver about this at age 8,  when routine screening begins. Direct examination of the colon should be repeated every 5 to 10 years through age 82, unless early forms of pre-cancerous polyps or small growths are found.  Hepatitis C blood testing is recommended for all people born from [birth year] through 1966 and any individual with known risks for hepatitis C.  Practice safe sex. Use condoms and avoid high-risk sexual practices to reduce the spread of sexually transmitted infections (STIs). Sexually active women aged 55 and younger should be checked for Chlamydia, which is a common sexually transmitted infection. Older women with new or multiple partners should also be tested for Chlamydia. Testing for other STIs is recommended if you are sexually active and at increased risk.  Osteoporosis is a disease in which the bones lose minerals and strength with aging. This can result in serious bone fractures. The risk of osteoporosis can be identified using a bone density scan. Women ages 4 and over and women at risk for fractures or osteoporosis should discuss screening with their caregivers. Ask your caregiver whether you should be taking a calcium supplement or vitamin D to reduce the rate of osteoporosis.  Menopause can be associated with physical symptoms and risks. Hormone replacement therapy is available to decrease symptoms and risks. You should talk to your caregiver about whether hormone replacement therapy is right for you.  Use sunscreen. Apply sunscreen liberally and repeatedly throughout the day. You should seek shade when your shadow is shorter than you. Protect yourself by wearing long sleeves, pants, a wide-brimmed hat, and sunglasses year round, whenever you are outdoors.  Notify your caregiver of new moles or changes in moles, especially if there is a change in shape or color. Also notify your caregiver if a mole is larger than the size of a pencil eraser.  Stay current with your immunizations. Document Released: 02/15/2011  Document Revised: 11/27/2012 Document Reviewed: 02/15/2011 [facility] Patient Information 2014 [place].

## 2013-12-21 LAB — URINALYSIS W MICROSCOPIC + REFLEX CULTURE
Bacteria, UA: NONE SEEN
Bilirubin Urine: NEGATIVE
CASTS: NONE SEEN
Crystals: NONE SEEN
Glucose, UA: NEGATIVE mg/dL
HGB URINE DIPSTICK: NEGATIVE
LEUKOCYTES UA: NEGATIVE
NITRITE: NEGATIVE
PH: 6.5 (ref 5.0–8.0)
PROTEIN: NEGATIVE mg/dL
Specific Gravity, Urine: 1.028 (ref 1.005–1.030)
UROBILINOGEN UA: 0.2 mg/dL (ref 0.0–1.0)

## 2014-01-02 ENCOUNTER — Other Ambulatory Visit: Payer: 59

## 2014-01-02 DIAGNOSIS — Z01419 Encounter for gynecological examination (general) (routine) without abnormal findings: Secondary | ICD-10-CM

## 2014-01-02 LAB — CBC WITH DIFFERENTIAL/PLATELET
BASOS ABS: 0.1 10*3/uL (ref 0.0–0.1)
Basophils Relative: 1 % (ref 0–1)
Eosinophils Absolute: 0.1 10*3/uL (ref 0.0–0.7)
Eosinophils Relative: 2 % (ref 0–5)
HCT: 42.9 % (ref 36.0–46.0)
Hemoglobin: 14.5 g/dL (ref 12.0–15.0)
LYMPHS PCT: 21 % (ref 12–46)
Lymphs Abs: 1.2 10*3/uL (ref 0.7–4.0)
MCH: 29.6 pg (ref 26.0–34.0)
MCHC: 33.8 g/dL (ref 30.0–36.0)
MCV: 87.6 fL (ref 78.0–100.0)
Monocytes Absolute: 0.3 10*3/uL (ref 0.1–1.0)
Monocytes Relative: 6 % (ref 3–12)
Neutro Abs: 4.1 10*3/uL (ref 1.7–7.7)
Neutrophils Relative %: 70 % (ref 43–77)
PLATELETS: 208 10*3/uL (ref 150–400)
RBC: 4.9 MIL/uL (ref 3.87–5.11)
RDW: 13.4 % (ref 11.5–15.5)
WBC: 5.8 10*3/uL (ref 4.0–10.5)

## 2014-01-02 LAB — LIPID PANEL
CHOL/HDL RATIO: 2.7 ratio
Cholesterol: 203 mg/dL — ABNORMAL HIGH (ref 0–200)
HDL: 75 mg/dL (ref >39)
LDL CALC: 118 mg/dL — AB (ref 0–99)
Triglycerides: 52 mg/dL (ref <150)
VLDL: 10 mg/dL (ref 0–40)

## 2014-01-02 LAB — COMPREHENSIVE METABOLIC PANEL
ALT: 17 U/L (ref 0–35)
AST: 24 U/L (ref 0–37)
Albumin: 4.4 g/dL (ref 3.5–5.2)
Alkaline Phosphatase: 65 U/L (ref 39–117)
BUN: 22 mg/dL (ref 6–23)
CHLORIDE: 105 meq/L (ref 96–112)
CO2: 31 mEq/L (ref 19–32)
CREATININE: 0.79 mg/dL (ref 0.50–1.10)
Calcium: 9.8 mg/dL (ref 8.4–10.5)
Glucose, Bld: 82 mg/dL (ref 70–99)
Potassium: 5 mEq/L (ref 3.5–5.3)
Sodium: 141 mEq/L (ref 135–145)
Total Bilirubin: 0.5 mg/dL (ref 0.2–1.2)
Total Protein: 6.6 g/dL (ref 6.0–8.3)

## 2014-01-02 LAB — TSH: TSH: 1.718 u[IU]/mL (ref 0.350–4.500)

## 2014-01-03 LAB — VITAMIN D 25 HYDROXY (VIT D DEFICIENCY, FRACTURES): VIT D 25 HYDROXY: 37 ng/mL (ref 30–89)

## 2014-01-23 ENCOUNTER — Ambulatory Visit (INDEPENDENT_AMBULATORY_CARE_PROVIDER_SITE_OTHER): Payer: Self-pay | Admitting: *Deleted

## 2014-01-23 DIAGNOSIS — I781 Nevus, non-neoplastic: Secondary | ICD-10-CM

## 2014-01-23 NOTE — Progress Notes (Signed)
   Cutaneous Laser:pulsed mode  814 j/cm2  delay  80ms duration  .5 spot  Total pulses: 2157 Total energy 3.437  Total time::28  Photos: yes from before  Compression stockings applied: NA  Retreatment of previously treated spiders. Increased strength. Tol well. All vessels turned red. Hoping for good results. Will follow prn.

## 2014-01-24 ENCOUNTER — Encounter: Payer: Self-pay | Admitting: Vascular Surgery

## 2014-02-09 ENCOUNTER — Telehealth: Payer: Self-pay | Admitting: Gynecology

## 2014-02-09 MED ORDER — CLINDAMYCIN PHOSPHATE 2 % VA CREA: 1.0000 | TOPICAL_CREAM | Freq: Every day | VAGINAL | Status: DC

## 2014-02-09 NOTE — Telephone Encounter (Signed)
Call note:  Out of town with new onset vag discharge, irritation and odor.  Hx of BV and feels the same as before.  No UTI symptoms.  Cleocin Vag cream X 2 kits ordered to have one as needed for the future.  OV if symptoms continues or worsen.

## 2014-05-02 ENCOUNTER — Ambulatory Visit (INDEPENDENT_AMBULATORY_CARE_PROVIDER_SITE_OTHER): Payer: 59 | Admitting: Gynecology

## 2014-05-02 ENCOUNTER — Encounter: Payer: Self-pay | Admitting: Gynecology

## 2014-05-02 DIAGNOSIS — N760 Acute vaginitis: Secondary | ICD-10-CM

## 2014-05-02 DIAGNOSIS — A499 Bacterial infection, unspecified: Secondary | ICD-10-CM

## 2014-05-02 DIAGNOSIS — B9689 Other specified bacterial agents as the cause of diseases classified elsewhere: Secondary | ICD-10-CM

## 2014-05-02 DIAGNOSIS — N898 Other specified noninflammatory disorders of vagina: Secondary | ICD-10-CM

## 2014-05-02 LAB — WET PREP FOR TRICH, YEAST, CLUE
CLUE CELLS WET PREP: NONE SEEN
TRICH WET PREP: NONE SEEN
Yeast Wet Prep HPF POC: NONE SEEN

## 2014-05-02 MED ORDER — CIPROFLOXACIN HCL 250 MG PO TABS: 250.0000 mg | ORAL_TABLET | Freq: Once | ORAL | Status: DC

## 2014-05-02 MED ORDER — METRONIDAZOLE 500 MG PO TABS: 500.0000 mg | ORAL_TABLET | Freq: Two times a day (BID) | ORAL | Status: DC

## 2014-05-02 MED ORDER — CLINDAMYCIN PHOSPHATE 2 % VA CREA: 1.0000 | TOPICAL_CREAM | Freq: Every day | VAGINAL | Status: DC

## 2014-05-02 NOTE — Progress Notes (Signed)
Donna Kaufman 08/27/1960 [id number]        53 y.o.  [id number] Presents with several days of slight vaginal discharge and odor. Patient thinks that she has a low level bacterial infection. She's had these in the past. No real itching. No urinary symptoms such as frequency dysuria or urgency.  Past medical history,surgical history, problem list, medications, allergies, family history and social history were all reviewed and documented in the EPIC chart.  Directed ROS with pertinent positives and negatives documented in the history of present illness/assessment and plan.  Exam: Kim assistant General appearance:  Normal Abdomen soft nontender without masses guarding rebound Pelvic external BUS vagina with yellowish discharge. Cervix normal. Uterus normal size midline mobile nontender. Adnexa without masses or tenderness.  Assessment/Plan:  53 y.o. [name] history, exam and wet prep consistent with a low level bacterial vaginosis. We'll treat with Flagyl 500 mg twice a day x7 days. Patient has for one refill in the event that she has recurrence which I provided. She also asked for Cleocin vaginal cream prescription just in case the Flagyl does not work I provided a written prescription for this. Lastly I refilled her ciprofloxacin 250 mg #30 with 2 refills that she uses for postcoital UTI prophylaxis.     Dara Lords MD, 3:45 PM 05/02/2014

## 2014-05-02 NOTE — Patient Instructions (Signed)
Take the Flagyl medication twice daily for 7 days.  Avoid alcohol while taking.  Follow-up if your symptoms persist, worsen or recur. 

## 2014-06-11 ENCOUNTER — Encounter: Payer: Self-pay | Admitting: *Deleted

## 2014-06-12 ENCOUNTER — Ambulatory Visit (INDEPENDENT_AMBULATORY_CARE_PROVIDER_SITE_OTHER): Payer: 59 | Admitting: *Deleted

## 2014-06-12 DIAGNOSIS — I8393 Asymptomatic varicose veins of bilateral lower extremities: Secondary | ICD-10-CM | POA: Insufficient documentation

## 2014-06-12 DIAGNOSIS — I780 Hereditary hemorrhagic telangiectasia: Secondary | ICD-10-CM

## 2014-06-12 NOTE — Progress Notes (Signed)
.  3% Sotradecol administered with a 27g butterfly.  Patient received a total of 4cc. Injected only the left leg. Did CL on both L and R legs. Tol well. Good local rxn to injection areas. Vessels were tiny. Increased jules on CL and results were good. Will follow prn. Cutaneous Laser:pulsed mode  816j/cm2 400 ms delay  13 ms Duration 0.5 sp  Total pulses: 1473 Total energy 2352  Total time::19  Photos: Yes.    Compression stockings applied: Yes.  and ace wrap over left knee areas

## 2014-06-17 ENCOUNTER — Encounter: Payer: Self-pay | Admitting: Gynecology

## 2014-06-27 ENCOUNTER — Other Ambulatory Visit: Payer: Self-pay | Admitting: Gynecology

## 2014-06-28 ENCOUNTER — Encounter: Payer: Self-pay | Admitting: Gynecology

## 2014-06-28 ENCOUNTER — Ambulatory Visit (INDEPENDENT_AMBULATORY_CARE_PROVIDER_SITE_OTHER): Payer: 59 | Admitting: Gynecology

## 2014-06-28 DIAGNOSIS — B373 Candidiasis of vulva and vagina: Secondary | ICD-10-CM

## 2014-06-28 DIAGNOSIS — N952 Postmenopausal atrophic vaginitis: Secondary | ICD-10-CM

## 2014-06-28 DIAGNOSIS — B3731 Acute candidiasis of vulva and vagina: Secondary | ICD-10-CM

## 2014-06-28 DIAGNOSIS — N898 Other specified noninflammatory disorders of vagina: Secondary | ICD-10-CM

## 2014-06-28 LAB — WET PREP FOR TRICH, YEAST, CLUE
Clue Cells Wet Prep HPF POC: NONE SEEN
TRICH WET PREP: NONE SEEN

## 2014-06-28 MED ORDER — FLUCONAZOLE 150 MG PO TABS: 150.0000 mg | ORAL_TABLET | Freq: Every day | ORAL | Status: DC

## 2014-06-28 NOTE — Progress Notes (Signed)
Donna McburneyDiane W Kaufman 03/03/1961 [id number]        53 y.o.  [id number] Presents with one-week history of vaginal discharge and odor. Was treated in September for bacterial vaginosis where she used rounds of Cleocin vaginal cream and one round of Flagyl which seem to clear the discharge she was having then but now this has recurred. We have discussed starting Vagifem in May and I gave her samples but she never started this.  Past medical history,surgical history, problem list, medications, allergies, family history and social history were all reviewed and documented in the EPIC chart.  Directed ROS with pertinent positives and negatives documented in the history of present illness/assessment and plan.  Exam: Kim assistant General appearance:  Normal Abdomen soft nontender without masses guarding rebound Pelvic external BUS vagina with atrophic changes. Scant discharge noted. Bimanual without masses or discharge.  Assessment/Plan:  53 y.o. [id number] with vaginal discharge and odor. Wet prep consistent with yeast. Will treat with Diflucan 150 mg tablet daily 2 days. I again discussed her recurrent vaginitis issues and options for treatment to include suppressive therapy such as weekly Flagyl or Cleocin will consider vaginal estrogen to improve vaginal health and possible resistance to infection. I again reviewed options to include Osphena, vaginal cream and Vagifem. Risks of absorption with systemic effects and risks including stroke heart attack DVT endometrial stimulation and possible increased risks of breast cancer.  Patient is going to think about this. If she decides to do so she will start the samples that I gave her twice weekly. If she cannot find them she'll call and we will prescribe them for her.     Dara LordsFONTAINE,Donna Kaufman P MD, 9:34 AM 06/28/2014

## 2014-06-28 NOTE — Patient Instructions (Signed)
Take the Diflucan pill daily for 2 days in a row. Call me if the vaginal symptoms persist, worsen or recur. If you decide to start the Vagifem vaginal estrogen tablets then start them twice weekly. If you do not have a supply at home that I gave you then call and we will send this prescription to your pharmacy.

## 2014-07-10 ENCOUNTER — Telehealth: Payer: Self-pay | Admitting: *Deleted

## 2014-07-10 ENCOUNTER — Other Ambulatory Visit: Payer: Self-pay | Admitting: Gynecology

## 2014-07-10 MED ORDER — CLINDAMYCIN PHOSPHATE 2 % VA CREA: 1.0000 | TOPICAL_CREAM | Freq: Every day | VAGINAL | Status: DC

## 2014-07-10 NOTE — Telephone Encounter (Signed)
Cleocin vaginal cream nightly 1 week 

## 2014-07-10 NOTE — Telephone Encounter (Signed)
Pt called to follow up from OV 06/28/14 still c/o vaginal discharge and odor. Pt asked if clindamycin could be sent? Please advise

## 2014-07-10 NOTE — Telephone Encounter (Signed)
Rx sent. Patient notified. 

## 2014-10-06 IMAGING — US US ABDOMEN LIMITED
1 series · 14 of 25 positions shown · non-contrast
Comparison: None.

CLINICAL DATA: 52-year-old female with abnormal liver enzymes.
Headache, dizziness, fever, back pain.

EXAM:
US ABDOMEN LIMITED - RIGHT UPPER QUADRANT

[Series 1: us abdomen limited · 0.24mm/px · 14 of 49 slices shown]
[im 1/49]
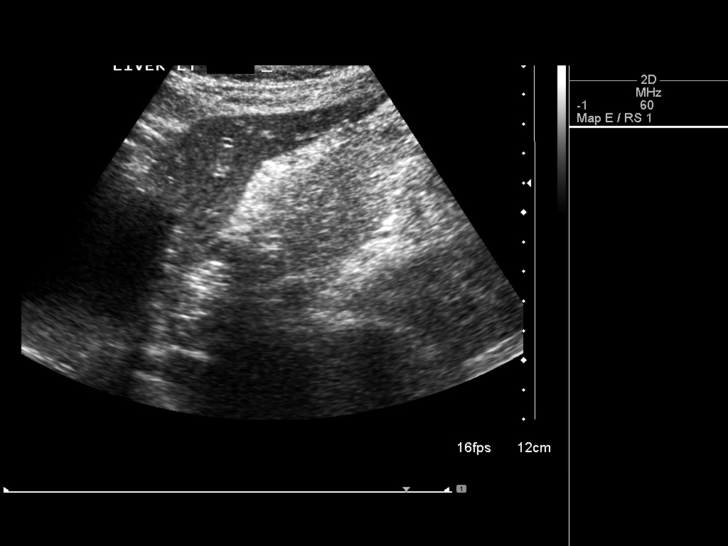
[im 5/49]
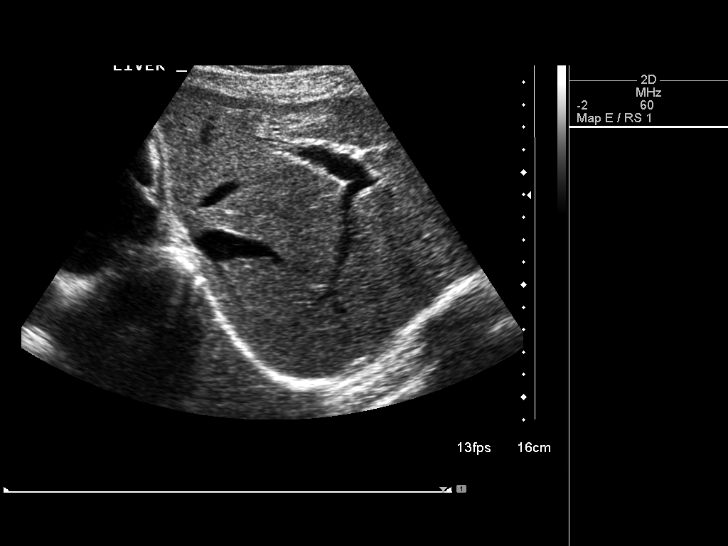
[im 9/49]
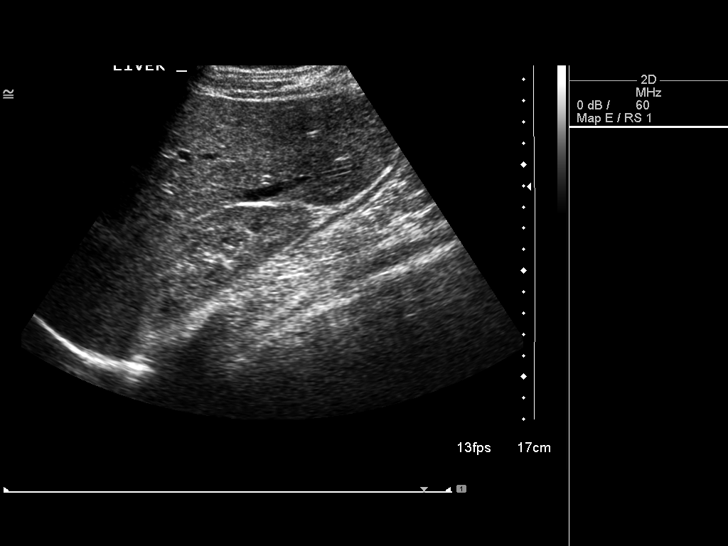
[im 13/49]
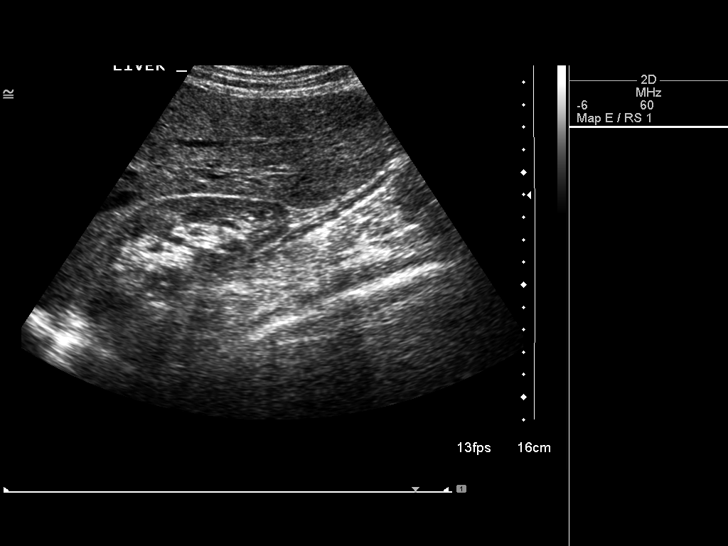
[im 17/49]
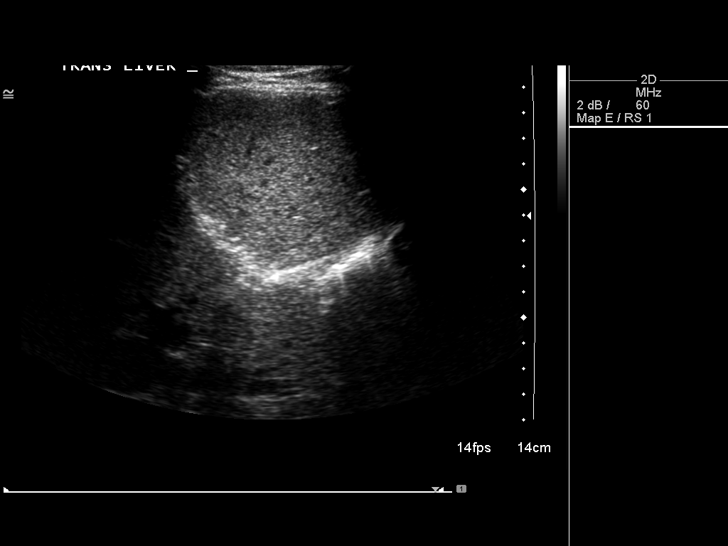
[im 19/49]
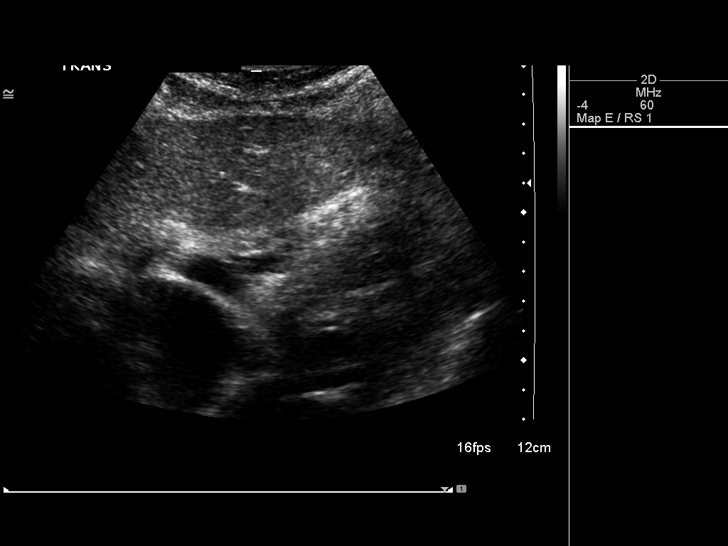
[im 23/49]
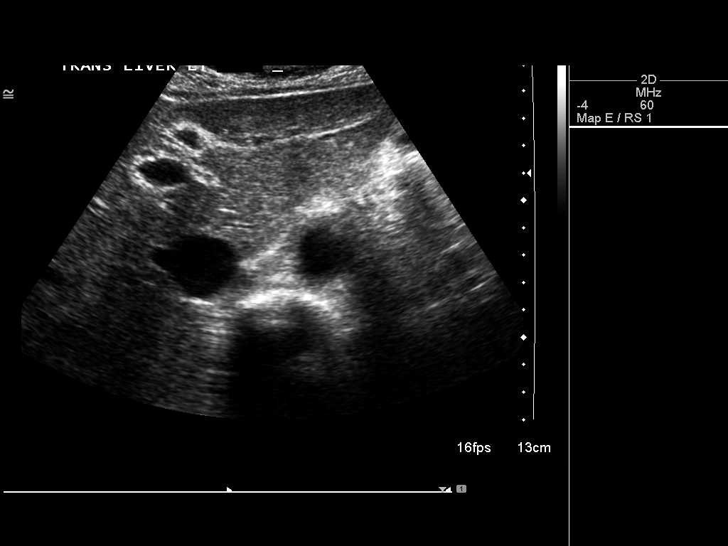
[im 27/49]
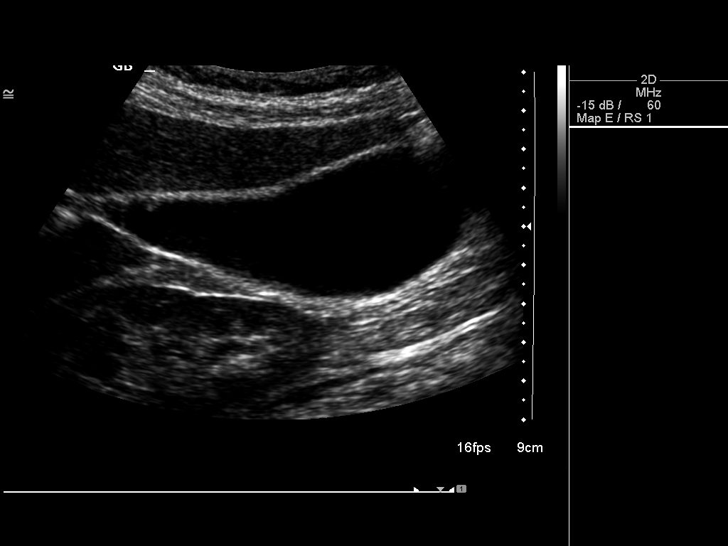
[im 31/49]
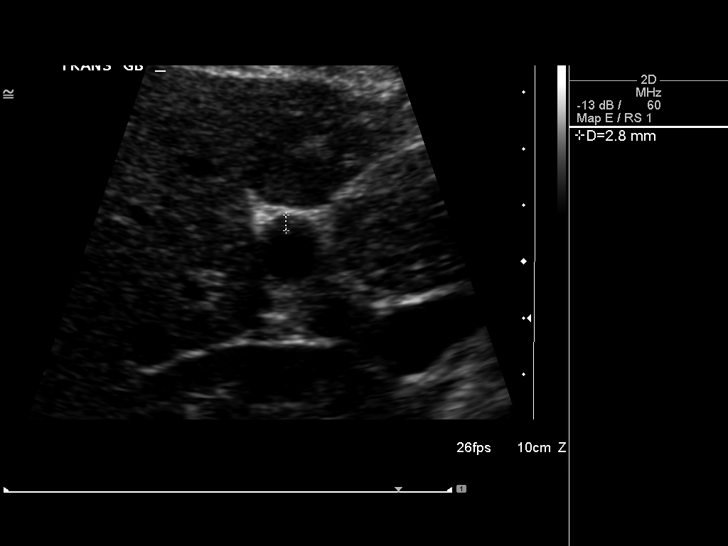
[im 33/49]
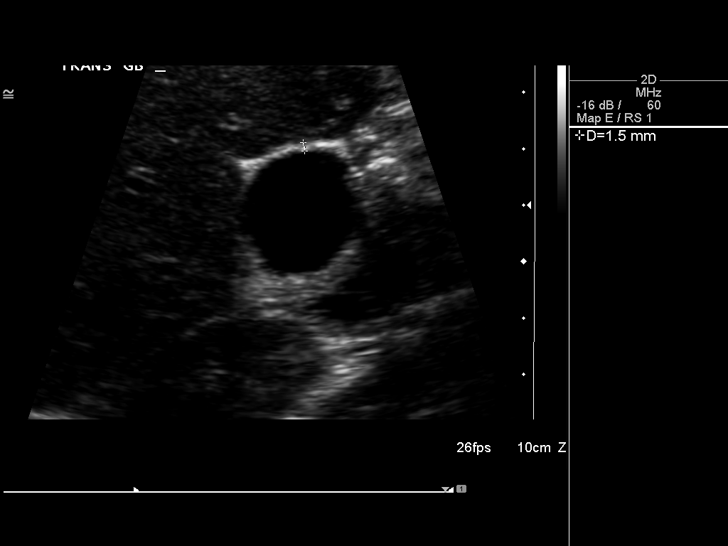
[im 37/49]
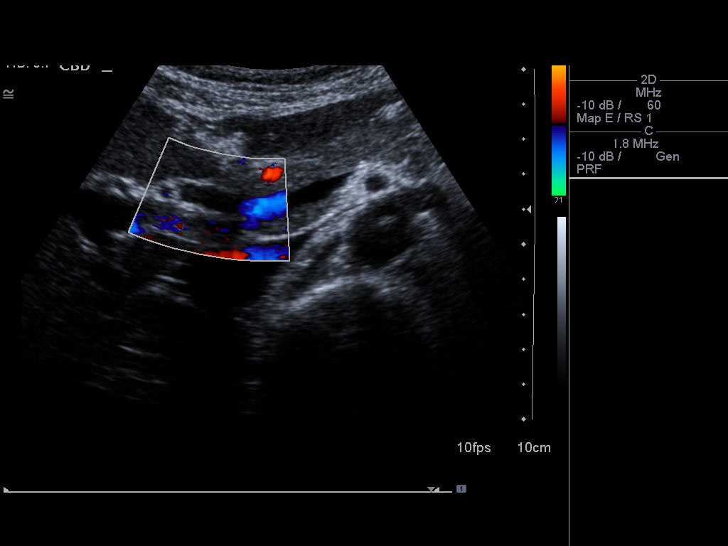
[im 41/49]
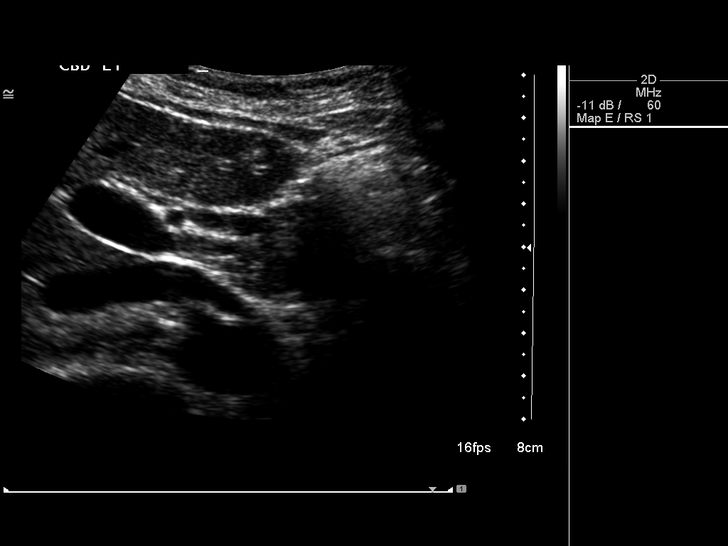
[im 45/49]
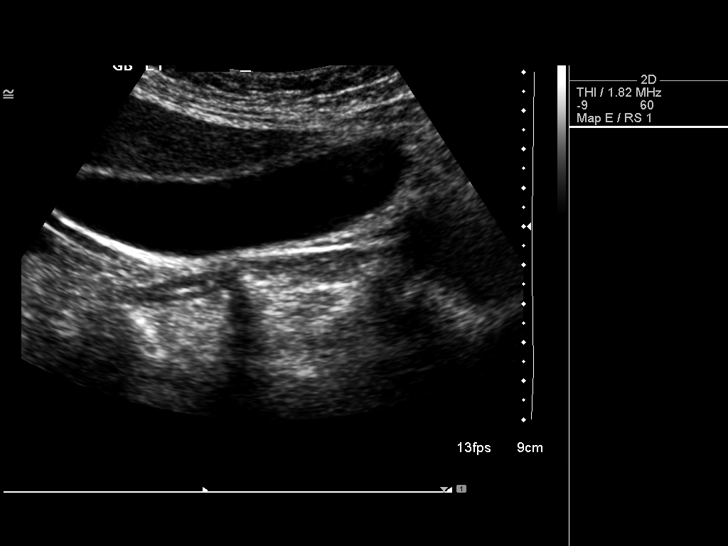
[im 49/49]
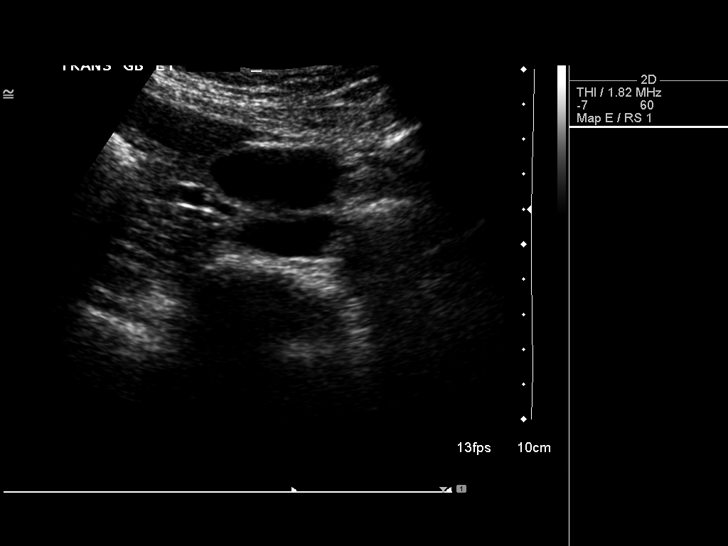

[14 of 25 positions shown; findings below may reference images not displayed]

FINDINGS: Gallbladder: No gallstones, gallbladder wall thickening, or
pericholecystic fluid. Suspicion of a tiny 4 mm mural polyp near the
neck (image 25). No sonographic Murphy sign elicited.

Common bile duct: Within normal limits in caliber. Diameter up to 6
mm.

Liver: No focal lesion identified. No intrahepatic ductal
dilatation. Within normal limits in parenchymal echogenicity. Liver
size at the upper limits of normal.

Other findings: Negative visible right kidney.
IMPRESSION: Essentially negative right upper quadrant ultrasound ;
inconsequential tiny 4 mm gallbladder polyp.

## 2014-10-10 ENCOUNTER — Telehealth: Payer: Self-pay | Admitting: *Deleted

## 2014-10-10 MED ORDER — ESTRADIOL 10 MCG VA TABS: 1.0000 | ORAL_TABLET | VAGINAL | Status: DC

## 2014-10-10 NOTE — Telephone Encounter (Addendum)
Pt called requesting a Rx for vagifem 10 mcg to pick up to use for on-line pharmacy. I left message for pt to call so find out if Rx should be mailed or will she pick up.

## 2014-10-10 NOTE — Telephone Encounter (Signed)
Pt called back and asked if a 6 months supply could be printed, Rx for vagifem 10 mg placed vaginally twice weekly, # 48 0 refill annual due in June.

## 2014-10-25 ENCOUNTER — Other Ambulatory Visit: Payer: Self-pay | Admitting: Gynecology

## 2015-01-17 ENCOUNTER — Encounter: Payer: Self-pay | Admitting: Women's Health

## 2015-01-17 ENCOUNTER — Ambulatory Visit (INDEPENDENT_AMBULATORY_CARE_PROVIDER_SITE_OTHER): Payer: 59 | Admitting: Women's Health

## 2015-01-17 DIAGNOSIS — A499 Bacterial infection, unspecified: Secondary | ICD-10-CM | POA: Diagnosis not present

## 2015-01-17 DIAGNOSIS — N760 Acute vaginitis: Secondary | ICD-10-CM | POA: Diagnosis not present

## 2015-01-17 DIAGNOSIS — B9689 Other specified bacterial agents as the cause of diseases classified elsewhere: Secondary | ICD-10-CM

## 2015-01-17 LAB — WET PREP FOR TRICH, YEAST, CLUE
Clue Cells Wet Prep HPF POC: NONE SEEN
Trich, Wet Prep: NONE SEEN
Yeast Wet Prep HPF POC: NONE SEEN

## 2015-01-17 MED ORDER — CLINDAMYCIN PHOSPHATE 2 % VA CREA: 1.0000 | TOPICAL_CREAM | Freq: Every day | VAGINAL | Status: DC

## 2015-01-17 NOTE — Addendum Note (Signed)
Addended by: Aura CampsWEBB, Pattijo Juste L on: 01/17/2015 02:48 PM   Modules accepted: Orders

## 2015-01-17 NOTE — Patient Instructions (Signed)
Bacterial Vaginosis Bacterial vaginosis is a vaginal infection that occurs when the normal balance of bacteria in the vagina is disrupted. It results from an overgrowth of certain bacteria. This is the most common vaginal infection in women of childbearing age. Treatment is important to prevent complications, especially in pregnant women, as it can cause a premature delivery. CAUSES  Bacterial vaginosis is caused by an increase in harmful bacteria that are normally present in smaller amounts in the vagina. Several different kinds of bacteria can cause bacterial vaginosis. However, the reason that the condition develops is not fully understood. RISK FACTORS Certain activities or behaviors can put you at an increased risk of developing bacterial vaginosis, including:  Having a new sex partner or multiple sex partners.  Douching.  Using an intrauterine device (IUD) for contraception. Women do not get bacterial vaginosis from toilet seats, bedding, swimming pools, or contact with objects around them. SIGNS AND SYMPTOMS  Some women with bacterial vaginosis have no signs or symptoms. Common symptoms include:  Grey vaginal discharge.  A fishlike odor with discharge, especially after sexual intercourse.  Itching or burning of the vagina and vulva.  Burning or pain with urination. DIAGNOSIS  Your health care provider will take a medical history and examine the vagina for signs of bacterial vaginosis. A sample of vaginal fluid may be taken. Your health care provider will look at this sample under a microscope to check for bacteria and abnormal cells. A vaginal pH test may also be done.  TREATMENT  Bacterial vaginosis may be treated with antibiotic medicines. These may be given in the form of a pill or a vaginal cream. A second round of antibiotics may be prescribed if the condition comes back after treatment.  HOME CARE INSTRUCTIONS   Only take over-the-counter or prescription medicines as  directed by your health care provider.  If antibiotic medicine was prescribed, take it as directed. Make sure you finish it even if you start to feel better.  Do not have sex until treatment is completed.  Tell all sexual partners that you have a vaginal infection. They should see their health care provider and be treated if they have problems, such as a mild rash or itching.  Practice safe sex by using condoms and only having one sex partner. SEEK MEDICAL CARE IF:   Your symptoms are not improving after 3 days of treatment.  You have increased discharge or pain.  You have a fever. MAKE SURE YOU:   Understand these instructions.  Will watch your condition.  Will get help right away if you are not doing well or get worse. FOR MORE INFORMATION  Centers for Disease Control and Prevention, Division of STD Prevention: www.cdc.gov/std American Sexual Health Association (ASHA): www.ashastd.org  Document Released: 08/02/2005 Document Revised: 05/23/2013 Document Reviewed: 03/14/2013 [employer] Patient Information 2015 [employer]. This information is not intended to replace advice given to you by your health care provider. Make sure you discuss any questions you have with your health care provider.  

## 2015-01-17 NOTE — Progress Notes (Signed)
Patient ID: Donna Kaufman, female   DOB: 02/01/1961, 54 y.o.   MRN: [id number] Presents with complaint of white milky discharge with odor and irritation, history of BV. Denies vaginal itching, UTI symptoms, abdominal pain or fever. Had been using Vagifem.  Exam: Appears well. External genitalia without erythema, dryness, speculum exam vaginal walls atrophic, minimal discharge, wet prep negative. Bimanual no CMT or fullness or tenderness.  Probable early BV  Plan: Clindamycin 2% vaginal cream 1 applicator at bedtime for several nights. Instructed to start back on Vagifem 2-3 times weekly per vagina. Call if no relief of symptoms. Keep scheduled annual exam appointment.

## 2015-03-07 ENCOUNTER — Other Ambulatory Visit: Payer: Self-pay | Admitting: Gastroenterology

## 2015-03-13 ENCOUNTER — Ambulatory Visit (INDEPENDENT_AMBULATORY_CARE_PROVIDER_SITE_OTHER): Payer: 59 | Admitting: Gynecology

## 2015-03-13 ENCOUNTER — Encounter: Payer: Self-pay | Admitting: Gynecology

## 2015-03-13 VITALS — BP 124/80 | Ht 70.0 in | Wt 153.0 lb

## 2015-03-13 DIAGNOSIS — Z01419 Encounter for gynecological examination (general) (routine) without abnormal findings: Secondary | ICD-10-CM

## 2015-03-13 DIAGNOSIS — N952 Postmenopausal atrophic vaginitis: Secondary | ICD-10-CM

## 2015-03-13 LAB — COMPREHENSIVE METABOLIC PANEL
ALBUMIN: 4.2 g/dL (ref 3.6–5.1)
ALK PHOS: 61 U/L (ref 33–130)
ALT: 9 U/L (ref 6–29)
AST: 19 U/L (ref 10–35)
BUN: 17 mg/dL (ref 7–25)
CO2: 27 mEq/L (ref 20–31)
CREATININE: 0.79 mg/dL (ref 0.50–1.05)
Calcium: 9.6 mg/dL (ref 8.6–10.4)
Chloride: 107 mEq/L (ref 98–110)
Glucose, Bld: 79 mg/dL (ref 65–99)
Potassium: 5.2 mEq/L (ref 3.5–5.3)
Sodium: 142 mEq/L (ref 135–146)
Total Bilirubin: 0.5 mg/dL (ref 0.2–1.2)
Total Protein: 6.2 g/dL (ref 6.1–8.1)

## 2015-03-13 LAB — CBC WITH DIFFERENTIAL/PLATELET
BASOS PCT: 1 % (ref 0–1)
Basophils Absolute: 0.1 10*3/uL (ref 0.0–0.1)
EOS PCT: 1 % (ref 0–5)
Eosinophils Absolute: 0.1 10*3/uL (ref 0.0–0.7)
HCT: 40.6 % (ref 36.0–46.0)
Hemoglobin: 13.5 g/dL (ref 12.0–15.0)
LYMPHS PCT: 20 % (ref 12–46)
Lymphs Abs: 1.3 10*3/uL (ref 0.7–4.0)
MCH: 29.7 pg (ref 26.0–34.0)
MCHC: 33.3 g/dL (ref 30.0–36.0)
MCV: 89.4 fL (ref 78.0–100.0)
MONO ABS: 0.5 10*3/uL (ref 0.1–1.0)
MONOS PCT: 8 % (ref 3–12)
MPV: 10 fL (ref 8.6–12.4)
NEUTROS ABS: 4.4 10*3/uL (ref 1.7–7.7)
NEUTROS PCT: 70 % (ref 43–77)
PLATELETS: 206 10*3/uL (ref 150–400)
RBC: 4.54 MIL/uL (ref 3.87–5.11)
RDW: 14 % (ref 11.5–15.5)
WBC: 6.3 10*3/uL (ref 4.0–10.5)

## 2015-03-13 LAB — TSH: TSH: 1.822 u[IU]/mL (ref 0.350–4.500)

## 2015-03-13 LAB — LIPID PANEL
CHOL/HDL RATIO: 2.4 ratio (ref <=5.0)
Cholesterol: 195 mg/dL (ref 125–200)
HDL: 81 mg/dL (ref >=46)
LDL Cholesterol: 103 mg/dL (ref <130)
TRIGLYCERIDES: 56 mg/dL (ref <150)
VLDL: 11 mg/dL (ref <30)

## 2015-03-13 MED ORDER — ESTRADIOL 10 MCG VA TABS: 1.0000 | ORAL_TABLET | VAGINAL | Status: DC

## 2015-03-13 NOTE — Progress Notes (Signed)
Donna Kaufman Jan 05, 1961 [id number]        54 y.o.  [id number] for annual exam.  Doing well without complaints.  Past medical history,surgical history, problem list, medications, allergies, family history and social history were all reviewed and documented as reviewed in the EPIC chart.  ROS:  Performed with pertinent positives and negatives included in the history, assessment and plan.   Additional significant findings :  none   Exam: [facility] person Vitals:   03/13/15 0948  BP: 124/80  Height:  (1.778 m)  Weight: 153 lb (69.4 kg)   General appearance:  Normal affect, orientation and appearance. Skin: Grossly normal HEENT: Without gross lesions.  No cervical or supraclavicular adenopathy. Thyroid normal.  Lungs:  Clear without wheezing, rales or rhonchi Cardiac: RR, without RMG Abdominal:  Soft, nontender, without masses, guarding, rebound, organomegaly or hernia Breasts:  Examined lying and sitting without masses, retractions, discharge or axillary adenopathy. Pelvic:  Ext/BUS/vagina normal  Cervix normal  Uterus anteverted, normal size, shape and contour, midline and mobile nontender   Adnexa  Without masses or tenderness    Anus and perineum  Normal   Rectovaginal  Normal sphincter tone without palpated masses or tenderness.    Assessment/Plan:  54 y.o. [id number] female for annual exam.   1. Postmenopausal/atrophic genital changes.  Patient continues on Vagifem doing well. I again reviewed the issues of absorption and possible risks to include thrombosis such as stroke heart attack DVT, breast cancer and endometrial stimulation.  Patient's comfortable continuing and I refilled her 1 year. Not having significant hot flashes or night sweats or any vaginal bleeding. Patient knows to report any vaginal bleeding. 2. Mammography overdue. Patient agrees to schedule. SBE monthly reviewed. 3. Pap smear/HPV negative 2014. No Pap smear done today. No history of abnormal Pap smears  previously. Plan repeated 3-5 year interval. 4. Colonoscopy this month. Repeat at their recommended interval. She is awaiting biopsy results from a polyp removal. 5. DEXA never. We'll plan further into the menopause. Increase calcium vitamin D reviewed. Check vitamin D level today. 6. Health maintenance. Baseline CBC, comprehensive metabolic panel, lipid profile, urinalysis, TSH, vitamin D ordered. Follow up in one year, sooner as needed.   Dara Lords MD, 10:14 AM 03/13/2015

## 2015-03-13 NOTE — Patient Instructions (Signed)
You may obtain a copy of any labs that were done today by logging onto MyChart as outlined in the instructions provided with your AVS (after visit summary). The office will not call with normal lab results but certainly if there are any significant abnormalities then we will contact you.   Health Maintenance, Female A healthy lifestyle and preventative care can promote health and wellness.  Maintain regular health, dental, and eye exams.  Eat a healthy diet. Foods like vegetables, fruits, whole grains, low-fat dairy products, and lean protein foods contain the nutrients you need without too many calories. Decrease your intake of foods high in solid fats, added sugars, and salt. Get information about a proper diet from your caregiver, if necessary.  Regular physical exercise is one of the most important things you can do for your health. Most adults should get at least 150 minutes of moderate-intensity exercise (any activity that increases your heart rate and causes you to sweat) each week. In addition, most adults need muscle-strengthening exercises on 2 or more days a week.   Maintain a healthy weight. The body mass index (BMI) is a screening tool to identify possible weight problems. It provides an estimate of body fat based on height and weight. Your caregiver can help determine your BMI, and can help you achieve or maintain a healthy weight. For adults 20 years and older:  A BMI below 18.5 is considered underweight.  A BMI of 18.5 to 24.9 is normal.  A BMI of 25 to 29.9 is considered overweight.  A BMI of 30 and above is considered obese.  Maintain normal blood lipids and cholesterol by exercising and minimizing your intake of saturated fat. Eat a balanced diet with plenty of fruits and vegetables. Blood tests for lipids and cholesterol should begin at age 61 and be repeated every 5 years. If your lipid or cholesterol levels are high, you are over 50, or you are a high risk for heart  disease, you may need your cholesterol levels checked more frequently.Ongoing high lipid and cholesterol levels should be treated with medicines if diet and exercise are not effective.  If you smoke, find out from your caregiver how to quit. If you do not use tobacco, do not start.  Lung cancer screening is recommended for adults aged 33 80 years who are at high risk for developing lung cancer because of a history of smoking. Yearly low-dose computed tomography (CT) is recommended for people who have at least a 30-pack-year history of smoking and are a current smoker or have quit within the past 15 years. A pack year of smoking is smoking an average of 1 pack of cigarettes a day for 1 year (for example: 1 pack a day for 30 years or 2 packs a day for 15 years). Yearly screening should continue until the smoker has stopped smoking for at least 15 years. Yearly screening should also be stopped for people who develop a health problem that would prevent them from having lung cancer treatment.  If you are pregnant, do not drink alcohol. If you are breastfeeding, be very cautious about drinking alcohol. If you are not pregnant and choose to drink alcohol, do not exceed 1 drink per day. One drink is considered to be 12 ounces (355 mL) of beer, 5 ounces (148 mL) of wine, or 1.5 ounces (44 mL) of liquor.  Avoid use of street drugs. Do not share needles with anyone. Ask for help if you need support or instructions about stopping  the use of drugs.  High blood pressure causes heart disease and increases the risk of stroke. Blood pressure should be checked at least every 1 to 2 years. Ongoing high blood pressure should be treated with medicines, if weight loss and exercise are not effective.  If you are 59 to 55 years old, ask your caregiver if you should take aspirin to prevent strokes.  Diabetes screening involves taking a blood sample to check your fasting blood sugar level. This should be done once every 3  years, after age 91, if you are within normal weight and without risk factors for diabetes. Testing should be considered at a younger age or be carried out more frequently if you are overweight and have at least 1 risk factor for diabetes.  Breast cancer screening is essential preventative care for women. You should practice "breast self-awareness." This means understanding the normal appearance and feel of your breasts and may include breast self-examination. Any changes detected, no matter how small, should be reported to a caregiver. Women in their 66s and 30s should have a clinical breast exam (CBE) by a caregiver as part of a regular health exam every 1 to 3 years. After age 101, women should have a CBE every year. Starting at age 100, women should consider having a mammogram (breast X-ray) every year. Women who have a family history of breast cancer should talk to their caregiver about genetic screening. Women at a high risk of breast cancer should talk to their caregiver about having an MRI and a mammogram every year.  Breast cancer gene (BRCA)-related cancer risk assessment is recommended for women who have family members with BRCA-related cancers. BRCA-related cancers include breast, ovarian, tubal, and peritoneal cancers. Having family members with these cancers may be associated with an increased risk for harmful changes (mutations) in the breast cancer genes BRCA1 and BRCA2. Results of the assessment will determine the need for genetic counseling and BRCA1 and BRCA2 testing.  The Pap test is a screening test for cervical cancer. Women should have a Pap test starting at age 57. Between ages 25 and 35, Pap tests should be repeated every 2 years. Beginning at age 37, you should have a Pap test every 3 years as long as the past 3 Pap tests have been normal. If you had a hysterectomy for a problem that was not cancer or a condition that could lead to cancer, then you no longer need Pap tests. If you are  between ages 50 and 76, and you have had normal Pap tests going back 10 years, you no longer need Pap tests. If you have had past treatment for cervical cancer or a condition that could lead to cancer, you need Pap tests and screening for cancer for at least 20 years after your treatment. If Pap tests have been discontinued, risk factors (such as a new sexual partner) need to be reassessed to determine if screening should be resumed. Some women have medical problems that increase the chance of getting cervical cancer. In these cases, your caregiver may recommend more frequent screening and Pap tests.  The human papillomavirus (HPV) test is an additional test that may be used for cervical cancer screening. The HPV test looks for the virus that can cause the cell changes on the cervix. The cells collected during the Pap test can be tested for HPV. The HPV test could be used to screen women aged 44 years and older, and should be used in women of any age  who have unclear Pap test results. After the age of 55, women should have HPV testing at the same frequency as a Pap test.  Colorectal cancer can be detected and often prevented. Most routine colorectal cancer screening begins at the age of 44 and continues through age 20. However, your caregiver may recommend screening at an earlier age if you have risk factors for colon cancer. On a yearly basis, your caregiver may provide home test kits to check for hidden blood in the stool. Use of a small camera at the end of a tube, to directly examine the colon (sigmoidoscopy or colonoscopy), can detect the earliest forms of colorectal cancer. Talk to your caregiver about this at age 86, when routine screening begins. Direct examination of the colon should be repeated every 5 to 10 years through age 13, unless early forms of pre-cancerous polyps or small growths are found.  Hepatitis C blood testing is recommended for all people born from [birth year] through 1966 and any  individual with known risks for hepatitis C.  Practice safe sex. Use condoms and avoid high-risk sexual practices to reduce the spread of sexually transmitted infections (STIs). Sexually active women aged 36 and younger should be checked for Chlamydia, which is a common sexually transmitted infection. Older women with new or multiple partners should also be tested for Chlamydia. Testing for other STIs is recommended if you are sexually active and at increased risk.  Osteoporosis is a disease in which the bones lose minerals and strength with aging. This can result in serious bone fractures. The risk of osteoporosis can be identified using a bone density scan. Women ages 20 and over and women at risk for fractures or osteoporosis should discuss screening with their caregivers. Ask your caregiver whether you should be taking a calcium supplement or vitamin D to reduce the rate of osteoporosis.  Menopause can be associated with physical symptoms and risks. Hormone replacement therapy is available to decrease symptoms and risks. You should talk to your caregiver about whether hormone replacement therapy is right for you.  Use sunscreen. Apply sunscreen liberally and repeatedly throughout the day. You should seek shade when your shadow is shorter than you. Protect yourself by wearing long sleeves, pants, a wide-brimmed hat, and sunglasses year round, whenever you are outdoors.  Notify your caregiver of new moles or changes in moles, especially if there is a change in shape or color. Also notify your caregiver if a mole is larger than the size of a pencil eraser.  Stay current with your immunizations. Document Released: 02/15/2011 Document Revised: 11/27/2012 Document Reviewed: 02/15/2011 [facility] Patient Information 2014 [employer].

## 2015-03-14 LAB — URINALYSIS W MICROSCOPIC + REFLEX CULTURE
Bacteria, UA: NONE SEEN [HPF]
Bilirubin Urine: NEGATIVE
Casts: NONE SEEN [LPF]
Crystals: NONE SEEN [HPF]
GLUCOSE, UA: NEGATIVE
Hgb urine dipstick: NEGATIVE
Ketones, ur: NEGATIVE
LEUKOCYTES UA: NEGATIVE
NITRITE: NEGATIVE
PH: 5.5 (ref 5.0–8.0)
PROTEIN: NEGATIVE
SPECIFIC GRAVITY, URINE: 1.027 (ref 1.001–1.035)
WBC, UA: NONE SEEN WBC/HPF (ref <=5)
Yeast: NONE SEEN [HPF]

## 2015-03-14 LAB — VITAMIN D 25 HYDROXY (VIT D DEFICIENCY, FRACTURES): Vit D, 25-Hydroxy: 28 ng/mL — ABNORMAL LOW (ref 30–100)

## 2015-04-08 ENCOUNTER — Encounter: Payer: Self-pay | Admitting: Women's Health

## 2015-04-08 ENCOUNTER — Ambulatory Visit (INDEPENDENT_AMBULATORY_CARE_PROVIDER_SITE_OTHER): Payer: 59 | Admitting: Women's Health

## 2015-04-08 VITALS — BP 120/80 | Wt 152.0 lb

## 2015-04-08 DIAGNOSIS — B009 Herpesviral infection, unspecified: Secondary | ICD-10-CM

## 2015-04-08 MED ORDER — VALACYCLOVIR HCL 500 MG PO TABS: ORAL_TABLET | ORAL | Status: DC

## 2015-04-08 NOTE — Progress Notes (Signed)
Patient ID: Donna Kaufman, female   DOB: 28-Jun-1961, 54 y.o.   MRN: [id number]  Presents with complaint of questionable blistery area on right buttocks. States this is the third time this has happened in the same spot with more itching than pain. Husband of 15 years/ no infidelity with history of HSV/ rare outbreaks. Denies vaginal discharge, urinary symptoms , fever or abdominal pain.  Exam: Appears well.  Right buttocks 2 cm healing blistery HSV-appearing area. No/minimal drainage. HSV culture taken.   Probable HSV  Plan: HSV culture pending. Reviewed could check blood work for HSV , prefered culture. Valtrex 500 twice daily for 3-5 days for subsequent outbreaks.  HSV information given and reviewed.

## 2015-04-08 NOTE — Patient Instructions (Signed)
Herpes Simplex Herpes simplex is generally classified as Type 1 or Type 2. Type 1 is generally the type that is responsible for cold sores. Type 2 is generally associated with sexually transmitted diseases. We now know that most of the thoughts on these viruses are inaccurate. We find that HSV1 is also present genitally and HSV2 can be present orally, but this will vary in different locations of the world. Herpes simplex is usually detected by doing a culture. Blood tests are also available for this virus; however, the accuracy is often not as good.  PREPARATION FOR TEST No preparation or fasting is necessary. NORMAL FINDINGS  No virus present  No HSV antigens or antibodies present Ranges for normal findings may vary among different laboratories and hospitals. You should always check with your doctor after having lab work or other tests done to discuss the meaning of your test results and whether your values are considered within normal limits. MEANING OF TEST  Your caregiver will go over the test results with you and discuss the importance and meaning of your results, as well as treatment options and the need for additional tests if necessary. OBTAINING THE TEST RESULTS  It is your responsibility to obtain your test results. Ask the lab or department performing the test when and how you will get your results. Document Released: 09/04/2004 Document Revised: 10/25/2011 Document Reviewed: 07/13/2008 [employer] Patient Information 2015 [employer]. This information is not intended to replace advice given to you by your health care provider. Make sure you discuss any questions you have with your health care provider.  

## 2015-04-10 ENCOUNTER — Encounter: Payer: Self-pay | Admitting: Gynecology

## 2015-04-10 ENCOUNTER — Encounter: Payer: Self-pay | Admitting: Women's Health

## 2015-04-10 DIAGNOSIS — B009 Herpesviral infection, unspecified: Secondary | ICD-10-CM | POA: Insufficient documentation

## 2015-04-10 LAB — HERPES SIMPLEX VIRUS CULTURE: ORGANISM ID, BACTERIA: DETECTED

## 2015-04-14 ENCOUNTER — Encounter: Payer: Self-pay | Admitting: Gynecology

## 2015-10-10 ENCOUNTER — Telehealth: Payer: Self-pay | Admitting: *Deleted

## 2015-10-10 MED ORDER — ACYCLOVIR 200 MG PO CAPS: ORAL_CAPSULE | ORAL | Status: DC

## 2015-10-10 NOTE — Telephone Encounter (Signed)
Pt aware Rx sent.  

## 2015-10-10 NOTE — Telephone Encounter (Signed)
Not sure the Valtrex is causing her back pain But she can try acyclovir 200 mg tablets 2 by mouth 3 times a day #30 with 5 refills

## 2015-10-10 NOTE — Telephone Encounter (Signed)
Pt takes Valtrex 500 mg twice daily for 3-5 day for onset outbreak, c/o lower pain discomfort this round of Valtrex. Started medication on Monday stopped Rx yesterday due to the back pain, states this is the first time taking the valtrex that has caused back pain. Pt asked she could be switched to another Rx, still has outbreak after taking 4 days of medication assumed that she would have relief after a couple days of Rx. Please advise

## 2016-03-15 ENCOUNTER — Ambulatory Visit (INDEPENDENT_AMBULATORY_CARE_PROVIDER_SITE_OTHER): Payer: 59 | Admitting: Gynecology

## 2016-03-15 ENCOUNTER — Encounter: Payer: Self-pay | Admitting: Gynecology

## 2016-03-15 VITALS — BP 116/74 | Ht 69.0 in | Wt 156.0 lb

## 2016-03-15 DIAGNOSIS — N952 Postmenopausal atrophic vaginitis: Secondary | ICD-10-CM | POA: Diagnosis not present

## 2016-03-15 DIAGNOSIS — Z13220 Encounter for screening for lipoid disorders: Secondary | ICD-10-CM | POA: Diagnosis not present

## 2016-03-15 DIAGNOSIS — Z01419 Encounter for gynecological examination (general) (routine) without abnormal findings: Secondary | ICD-10-CM

## 2016-03-15 DIAGNOSIS — Z1329 Encounter for screening for other suspected endocrine disorder: Secondary | ICD-10-CM

## 2016-03-15 DIAGNOSIS — Z1321 Encounter for screening for nutritional disorder: Secondary | ICD-10-CM

## 2016-03-15 LAB — CBC WITH DIFFERENTIAL/PLATELET
BASOS ABS: 61 {cells}/uL (ref 0–200)
BASOS PCT: 1 %
EOS PCT: 1 %
Eosinophils Absolute: 61 cells/uL (ref 15–500)
HCT: 42.6 % (ref 35.0–45.0)
Hemoglobin: 14.1 g/dL (ref 11.7–15.5)
Lymphocytes Relative: 22 %
Lymphs Abs: 1342 cells/uL (ref 850–3900)
MCH: 29.9 pg (ref 27.0–33.0)
MCHC: 33.1 g/dL (ref 32.0–36.0)
MCV: 90.3 fL (ref 80.0–100.0)
MONOS PCT: 9 %
MPV: 10.2 fL (ref 7.5–12.5)
Monocytes Absolute: 549 cells/uL (ref 200–950)
NEUTROS ABS: 4087 {cells}/uL (ref 1500–7800)
Neutrophils Relative %: 67 %
PLATELETS: 209 10*3/uL (ref 140–400)
RBC: 4.72 MIL/uL (ref 3.80–5.10)
RDW: 13.7 % (ref 11.0–15.0)
WBC: 6.1 10*3/uL (ref 3.8–10.8)

## 2016-03-15 LAB — URINALYSIS W MICROSCOPIC + REFLEX CULTURE
BACTERIA UA: NONE SEEN [HPF]
BILIRUBIN URINE: NEGATIVE
Casts: NONE SEEN [LPF]
Crystals: NONE SEEN [HPF]
GLUCOSE, UA: NEGATIVE
HGB URINE DIPSTICK: NEGATIVE
Ketones, ur: NEGATIVE
LEUKOCYTES UA: NEGATIVE
NITRITE: NEGATIVE
PH: 5.5 (ref 5.0–8.0)
PROTEIN: NEGATIVE
Specific Gravity, Urine: 1.016 (ref 1.001–1.035)
YEAST: NONE SEEN [HPF]

## 2016-03-15 LAB — COMPREHENSIVE METABOLIC PANEL
ALT: 16 U/L (ref 6–29)
AST: 24 U/L (ref 10–35)
Albumin: 4.4 g/dL (ref 3.6–5.1)
Alkaline Phosphatase: 53 U/L (ref 33–130)
BUN: 20 mg/dL (ref 7–25)
CHLORIDE: 104 mmol/L (ref 98–110)
CO2: 27 mmol/L (ref 20–31)
CREATININE: 0.9 mg/dL (ref 0.50–1.05)
Calcium: 9.5 mg/dL (ref 8.6–10.4)
Glucose, Bld: 80 mg/dL (ref 65–99)
Potassium: 4.8 mmol/L (ref 3.5–5.3)
SODIUM: 138 mmol/L (ref 135–146)
TOTAL PROTEIN: 6.3 g/dL (ref 6.1–8.1)
Total Bilirubin: 0.5 mg/dL (ref 0.2–1.2)

## 2016-03-15 LAB — LIPID PANEL
CHOLESTEROL: 194 mg/dL (ref 125–200)
HDL: 79 mg/dL (ref >=46)
LDL CALC: 104 mg/dL (ref <130)
Total CHOL/HDL Ratio: 2.5 Ratio (ref <=5.0)
Triglycerides: 57 mg/dL (ref <150)
VLDL: 11 mg/dL (ref <30)

## 2016-03-15 LAB — TSH: TSH: 1.89 mIU/L

## 2016-03-15 MED ORDER — ESTRADIOL 10 MCG VA TABS: 1.0000 | ORAL_TABLET | VAGINAL | 4 refills | Status: DC

## 2016-03-15 NOTE — Progress Notes (Signed)
    JANNAE PERRIGO Mar 18, 1961 [id number]        55 y.o.  [id number]  for annual exam.  Doing well. Several issues noted below.  Past medical history,surgical history, problem list, medications, allergies, family history and social history were all reviewed and documented as reviewed in the EPIC chart.  ROS:  Performed with pertinent positives and negatives included in the history, assessment and plan.   Additional significant findings :  None   Exam: Kennon Portela assistant Vitals:   03/15/16 0936  BP: 116/74  Weight: 156 lb (70.8 kg)  Height: 5\' 9"  (1.753 m)   General appearance:  Normal affect, orientation and appearance. Skin: Grossly normal HEENT: Without gross lesions.  No cervical or supraclavicular adenopathy. Thyroid normal.  Lungs:  Clear without wheezing, rales or rhonchi Cardiac: RR, without RMG Abdominal:  Soft, nontender, without masses, guarding, rebound, organomegaly or hernia Breasts:  Examined lying and sitting without masses, retractions, discharge or axillary adenopathy. Pelvic:  Ext/BUS/Vagina normal with atrophic changes  Cervix normal  Uterus anteverted, normal size, shape and contour, midline and mobile nontender   Adnexa without masses or tenderness    Anus and perineum normal   Rectovaginal normal sphincter tone without palpated masses or tenderness.    Assessment/Plan:  55 y.o. [id number] female for annual exam.   1. Postmenopausal/atrophic genital changes.  Continues with Vagifem 10 g twice weekly. Doing well with this and wants to continue. Have discussed the issues of absorption with systemic effects such as thrombosis, breast cancer, endometrial stimulation. Refill 1 year provided. 2. History of HSV. Occasional outbreaks. Has supply of Valtrex at home. Will call if needs more. 3. Mammography 03/2015. Continue with annual mammography next month. SBE monthly reviewed. 4. Colonoscopy 2016. Repeat at their recommended interval. 5. Pap smear/HPV 2014. No Pap  smear done today. No history of abnormal Pap smears. Plan repeat Pap smear at 5 year interval per current screening guidelines. 6. DEXA never. Strong family history osteoporosis in mother and sister. Check baseline DEXA now and patient will schedule. Check vitamin D level today. 7. Health maintenance. Baseline CBC, CMP, lipid profile, urinalysis, TSH, vitamin D ordered. Follow up for bone density otherwise annual exam in one year   Dara Lords MD, 10:06 AM 03/15/2016

## 2016-03-15 NOTE — Patient Instructions (Signed)

## 2016-03-16 ENCOUNTER — Telehealth: Payer: Self-pay | Admitting: Gynecology

## 2016-03-16 LAB — VITAMIN D 25 HYDROXY (VIT D DEFICIENCY, FRACTURES): VIT D 25 HYDROXY: 34 ng/mL (ref 30–100)

## 2016-03-16 NOTE — Telephone Encounter (Signed)
Pt informed with the below note. 

## 2016-03-16 NOTE — Telephone Encounter (Signed)
Tell patient Vit D lower limits of normal. Recommend additional 1000 units Vit D OTC daily

## 2016-03-17 LAB — URINE CULTURE: Organism ID, Bacteria: 10000

## 2016-04-16 DIAGNOSIS — M8580 Other specified disorders of bone density and structure, unspecified site: Secondary | ICD-10-CM

## 2016-04-16 HISTORY — DX: Other specified disorders of bone density and structure, unspecified site: M85.80

## 2016-05-11 ENCOUNTER — Telehealth: Payer: Self-pay | Admitting: Gynecology

## 2016-05-11 ENCOUNTER — Other Ambulatory Visit: Payer: Self-pay | Admitting: Gynecology

## 2016-05-11 ENCOUNTER — Encounter: Payer: Self-pay | Admitting: Gynecology

## 2016-05-11 ENCOUNTER — Ambulatory Visit (INDEPENDENT_AMBULATORY_CARE_PROVIDER_SITE_OTHER): Payer: 59

## 2016-05-11 DIAGNOSIS — Z13820 Encounter for screening for osteoporosis: Secondary | ICD-10-CM

## 2016-05-11 DIAGNOSIS — M8580 Other specified disorders of bone density and structure, unspecified site: Secondary | ICD-10-CM

## 2016-05-11 DIAGNOSIS — Z01419 Encounter for gynecological examination (general) (routine) without abnormal findings: Secondary | ICD-10-CM

## 2016-05-11 DIAGNOSIS — M899 Disorder of bone, unspecified: Secondary | ICD-10-CM

## 2016-05-11 NOTE — Telephone Encounter (Signed)
Pt informed,orders placed, states she will back to schedule.

## 2016-05-11 NOTE — Telephone Encounter (Signed)
Tell patient her bone density does show osteopenia. Because of her younger age I would recommend checking a PTH and a 24-hour urine collection for calcium excretion. I would also recheck her vitamin D level as it was borderline low we saw it in July. It is not to the degree that I would recommend medication at this point but I would recommend repeating the bone density in 2 years.

## 2016-10-20 ENCOUNTER — Other Ambulatory Visit: Payer: Self-pay | Admitting: Women's Health

## 2016-10-20 DIAGNOSIS — B009 Herpesviral infection, unspecified: Secondary | ICD-10-CM

## 2017-03-16 ENCOUNTER — Encounter: Payer: 59 | Admitting: Gynecology

## 2017-03-23 ENCOUNTER — Telehealth: Payer: Self-pay | Admitting: *Deleted

## 2017-03-23 MED ORDER — ESTRADIOL 10 MCG VA TABS: 1.0000 | ORAL_TABLET | VAGINAL | 0 refills | Status: DC

## 2017-03-23 NOTE — Telephone Encounter (Signed)
Pt called requesting refill on vagifem 10 mcg printed to mail in to [facility] to get for a cheaper price. Pt annual scheduled on 05/06/17, Rx will be printed and signed, pt will come pick up.

## 2017-05-06 ENCOUNTER — Ambulatory Visit (INDEPENDENT_AMBULATORY_CARE_PROVIDER_SITE_OTHER): Payer: 59 | Admitting: Gynecology

## 2017-05-06 ENCOUNTER — Encounter: Payer: Self-pay | Admitting: Gynecology

## 2017-05-06 VITALS — BP 118/76 | Ht 69.0 in | Wt 149.0 lb

## 2017-05-06 DIAGNOSIS — A609 Anogenital herpesviral infection, unspecified: Secondary | ICD-10-CM | POA: Diagnosis not present

## 2017-05-06 DIAGNOSIS — M8580 Other specified disorders of bone density and structure, unspecified site: Secondary | ICD-10-CM | POA: Diagnosis not present

## 2017-05-06 DIAGNOSIS — N952 Postmenopausal atrophic vaginitis: Secondary | ICD-10-CM | POA: Diagnosis not present

## 2017-05-06 DIAGNOSIS — Z13220 Encounter for screening for lipoid disorders: Secondary | ICD-10-CM

## 2017-05-06 DIAGNOSIS — Z01411 Encounter for gynecological examination (general) (routine) with abnormal findings: Secondary | ICD-10-CM | POA: Diagnosis not present

## 2017-05-06 MED ORDER — ESTRADIOL 10 MCG VA TABS: 1.0000 | ORAL_TABLET | VAGINAL | 4 refills | Status: DC

## 2017-05-06 NOTE — Patient Instructions (Signed)
Follow up for lab work as ordered  Schedule your mammogram

## 2017-05-06 NOTE — Addendum Note (Signed)
Addended by: Dayna Barker on: 05/06/2017 12:38 PM   Modules accepted: Orders

## 2017-05-06 NOTE — Progress Notes (Signed)
    Donna Kaufman [birth year] [id number]        56 y.o.  [id number] for annual gynecologic exam.  History of DEXA 2017 T score -2.2 FRAX 5%/0.3%. Had discussed this with her on the telephone and she was to have secondary workup done to include 24-hour urine calcium excretion, PTH, TSH and vitamin D. Patient never followed up for this lab work.  Past medical history,surgical history, problem list, medications, allergies, family history and social history were all reviewed and documented as reviewed in the EPIC chart.  ROS:  Performed with pertinent positives and negatives included in the history, assessment and plan.   Additional significant findings :  None   Exam: Kennon Portela assistant Vitals:   05/06/17 1134  BP: 118/76  Weight: 149 lb (67.6 kg)  Height:  (1.753 m)   Body mass index is 22 kg/m.  General appearance:  Normal affect, orientation and appearance. Skin: Grossly normal HEENT: Without gross lesions.  No cervical or supraclavicular adenopathy. Thyroid normal.  Lungs:  Clear without wheezing, rales or rhonchi Cardiac: RR, without RMG Abdominal:  Soft, nontender, without masses, guarding, rebound, organomegaly or hernia Breasts:  Examined lying and sitting without masses, retractions, discharge or axillary adenopathy. Pelvic:  Ext, BUS, Vagina: Normal with atrophic changes  Cervix: With atrophic changes  Uterus: Anteverted, normal size, shape and contour, midline and mobile nontender   Adnexa: Without masses or tenderness    Anus and perineum: Normal   Rectovaginal: Normal sphincter tone without palpated masses or tenderness.    Assessment/Plan:  56 y.o. [name] female for annual gynecologic exam.   1. Postmenopausal/atrophic genital changes. Using Vagifem 10 g twice weekly. She is doing well with this and wants to continue. We have discussed the issues of absorption to include systemic effects such as breast cancer thrombosis and endometrial stimulation. She is  comfortable continuing I refilled her 1 year. 2. History of HSV with occasional outbreaks. Has Valtrex at home but will call when needs more. Options for daily suppressive dose discussed but she prefers using intermittent treatment with outbreaks. 3. Mammography 2016. Patient knows she is overdue and agrees to call and schedule. Breast exam normal today. 4. Pap smear/HPV 2014.  Pap smear done today. No history of abnormal Pap smears previously. 5. Osteopenia. DEXA 2017 T score -2.2. FRAX 5%/0.3%. Strong family history of osteoporosis both in her sister and mother. Did not do secondary workup blood work as I had asked earlier. Future orders placed and she is going to return to have a 24-hour urine calcium excretion, vitamin D, TSH and PTH. Discussed treatment options but with normal FRAX will plan repeat DEXA at 2 years. Maximizing calcium vitamin D and weightbearing exercise discussed. 6. Health maintenance. Future orders placed for fasting CBC, CMP, lipid profile, urinalysis along with her above osteopenia blood work. Follow up in one year, sooner as needed.   Dara Lords MD, 12:27 PM 05/06/2017

## 2017-05-08 LAB — URINE CULTURE
MICRO NUMBER:: 81052168
SPECIMEN QUALITY: ADEQUATE

## 2017-05-08 LAB — URINALYSIS W MICROSCOPIC + REFLEX CULTURE
BILIRUBIN URINE: NEGATIVE
Bacteria, UA: NONE SEEN /HPF
GLUCOSE, UA: NEGATIVE
Hgb urine dipstick: NEGATIVE
Hyaline Cast: NONE SEEN /LPF
NITRITES URINE, INITIAL: NEGATIVE
Protein, ur: NEGATIVE
SPECIFIC GRAVITY, URINE: 1.018 (ref 1.001–1.03)
pH: <=5 (ref 5.0–8.0)

## 2017-05-08 LAB — CULTURE INDICATED

## 2017-05-09 LAB — PAP IG W/ RFLX HPV ASCU

## 2017-09-26 DIAGNOSIS — F4323 Adjustment disorder with mixed anxiety and depressed mood: Secondary | ICD-10-CM | POA: Diagnosis not present

## 2017-10-11 DIAGNOSIS — F4323 Adjustment disorder with mixed anxiety and depressed mood: Secondary | ICD-10-CM | POA: Diagnosis not present

## 2017-10-25 DIAGNOSIS — F4323 Adjustment disorder with mixed anxiety and depressed mood: Secondary | ICD-10-CM | POA: Diagnosis not present

## 2017-11-01 DIAGNOSIS — F4323 Adjustment disorder with mixed anxiety and depressed mood: Secondary | ICD-10-CM | POA: Diagnosis not present

## 2017-11-07 DIAGNOSIS — H10413 Chronic giant papillary conjunctivitis, bilateral: Secondary | ICD-10-CM | POA: Diagnosis not present

## 2017-11-07 DIAGNOSIS — H01022 Squamous blepharitis right lower eyelid: Secondary | ICD-10-CM | POA: Diagnosis not present

## 2017-11-07 DIAGNOSIS — H43812 Vitreous degeneration, left eye: Secondary | ICD-10-CM | POA: Diagnosis not present

## 2017-11-07 DIAGNOSIS — H01021 Squamous blepharitis right upper eyelid: Secondary | ICD-10-CM | POA: Diagnosis not present

## 2017-11-08 DIAGNOSIS — F4323 Adjustment disorder with mixed anxiety and depressed mood: Secondary | ICD-10-CM | POA: Diagnosis not present

## 2017-11-15 DIAGNOSIS — F4323 Adjustment disorder with mixed anxiety and depressed mood: Secondary | ICD-10-CM | POA: Diagnosis not present

## 2017-11-22 DIAGNOSIS — F4323 Adjustment disorder with mixed anxiety and depressed mood: Secondary | ICD-10-CM | POA: Diagnosis not present

## 2017-11-24 ENCOUNTER — Telehealth: Payer: Self-pay

## 2017-11-24 DIAGNOSIS — B009 Herpesviral infection, unspecified: Secondary | ICD-10-CM

## 2017-11-24 MED ORDER — VALACYCLOVIR HCL 500 MG PO TABS: ORAL_TABLET | ORAL | 2 refills | Status: DC

## 2017-11-24 NOTE — Telephone Encounter (Signed)
Patient said she is having to change pharmacy and needs her Valtrex Rx sent here. Rx sent.

## 2017-11-29 DIAGNOSIS — F4323 Adjustment disorder with mixed anxiety and depressed mood: Secondary | ICD-10-CM | POA: Diagnosis not present

## 2018-03-02 DIAGNOSIS — F4323 Adjustment disorder with mixed anxiety and depressed mood: Secondary | ICD-10-CM | POA: Diagnosis not present

## 2018-03-24 DIAGNOSIS — F4323 Adjustment disorder with mixed anxiety and depressed mood: Secondary | ICD-10-CM | POA: Diagnosis not present

## 2018-04-12 DIAGNOSIS — F4323 Adjustment disorder with mixed anxiety and depressed mood: Secondary | ICD-10-CM | POA: Diagnosis not present

## 2018-04-27 DIAGNOSIS — F4323 Adjustment disorder with mixed anxiety and depressed mood: Secondary | ICD-10-CM | POA: Diagnosis not present

## 2018-05-05 DIAGNOSIS — F4323 Adjustment disorder with mixed anxiety and depressed mood: Secondary | ICD-10-CM | POA: Diagnosis not present

## 2018-05-06 DIAGNOSIS — L509 Urticaria, unspecified: Secondary | ICD-10-CM | POA: Diagnosis not present

## 2018-05-06 DIAGNOSIS — B9689 Other specified bacterial agents as the cause of diseases classified elsewhere: Secondary | ICD-10-CM | POA: Diagnosis not present

## 2018-05-06 DIAGNOSIS — J019 Acute sinusitis, unspecified: Secondary | ICD-10-CM | POA: Diagnosis not present

## 2018-05-08 DIAGNOSIS — Y69 Unspecified misadventure during surgical and medical care: Secondary | ICD-10-CM | POA: Diagnosis not present

## 2018-05-08 DIAGNOSIS — M79672 Pain in left foot: Secondary | ICD-10-CM | POA: Insufficient documentation

## 2018-05-08 DIAGNOSIS — T50905A Adverse effect of unspecified drugs, medicaments and biological substances, initial encounter: Secondary | ICD-10-CM | POA: Diagnosis not present

## 2018-05-08 DIAGNOSIS — T887XXA Unspecified adverse effect of drug or medicament, initial encounter: Secondary | ICD-10-CM | POA: Insufficient documentation

## 2018-05-08 DIAGNOSIS — M25512 Pain in left shoulder: Secondary | ICD-10-CM | POA: Diagnosis not present

## 2018-05-08 DIAGNOSIS — R251 Tremor, unspecified: Secondary | ICD-10-CM | POA: Diagnosis not present

## 2018-05-08 DIAGNOSIS — R531 Weakness: Secondary | ICD-10-CM | POA: Insufficient documentation

## 2018-05-08 DIAGNOSIS — M79671 Pain in right foot: Secondary | ICD-10-CM | POA: Diagnosis not present

## 2018-05-08 DIAGNOSIS — T360X5A Adverse effect of penicillins, initial encounter: Secondary | ICD-10-CM | POA: Diagnosis not present

## 2018-05-08 DIAGNOSIS — Z79899 Other long term (current) drug therapy: Secondary | ICD-10-CM | POA: Insufficient documentation

## 2018-05-08 DIAGNOSIS — M2550 Pain in unspecified joint: Secondary | ICD-10-CM | POA: Diagnosis not present

## 2018-05-09 ENCOUNTER — Other Ambulatory Visit: Payer: Self-pay

## 2018-05-09 ENCOUNTER — Emergency Department (HOSPITAL_COMMUNITY)
Admission: EM | Admit: 2018-05-09 | Discharge: 2018-05-09 | Disposition: A | Discharge status: Home / Self Care | Payer: BLUE CROSS/BLUE SHIELD | Attending: Emergency Medicine | Admitting: Emergency Medicine

## 2018-05-09 DIAGNOSIS — T50905A Adverse effect of unspecified drugs, medicaments and biological substances, initial encounter: Secondary | ICD-10-CM

## 2018-05-09 DIAGNOSIS — T7840XA Allergy, unspecified, initial encounter: Secondary | ICD-10-CM | POA: Diagnosis not present

## 2018-05-09 LAB — BASIC METABOLIC PANEL
Anion gap: 9 (ref 5–15)
BUN: 14 mg/dL (ref 6–20)
CALCIUM: 9.3 mg/dL (ref 8.9–10.3)
CHLORIDE: 105 mmol/L (ref 98–111)
CO2: 25 mmol/L (ref 22–32)
CREATININE: 0.77 mg/dL (ref 0.44–1.00)
GFR calc non Af Amer: >60 mL/min (ref >60)
GLUCOSE: 113 mg/dL — AB (ref 70–99)
Potassium: 3.4 mmol/L — ABNORMAL LOW (ref 3.5–5.1)
Sodium: 139 mmol/L (ref 135–145)

## 2018-05-09 LAB — CBG MONITORING, ED: GLUCOSE-CAPILLARY: 85 mg/dL (ref 70–99)

## 2018-05-09 LAB — CBC WITH DIFFERENTIAL/PLATELET
BASOS PCT: 0 %
Basophils Absolute: 0 10*3/uL (ref 0.0–0.1)
Eosinophils Absolute: 0 10*3/uL (ref 0.0–0.7)
Eosinophils Relative: 0 %
HEMATOCRIT: 38.9 % (ref 36.0–46.0)
HEMOGLOBIN: 13.1 g/dL (ref 12.0–15.0)
LYMPHS ABS: 1.6 10*3/uL (ref 0.7–4.0)
Lymphocytes Relative: 11 %
MCH: 31 pg (ref 26.0–34.0)
MCHC: 33.7 g/dL (ref 30.0–36.0)
MCV: 92.2 fL (ref 78.0–100.0)
MONOS PCT: 9 %
Monocytes Absolute: 1.4 10*3/uL — ABNORMAL HIGH (ref 0.1–1.0)
NEUTROS ABS: 11.9 10*3/uL — AB (ref 1.7–7.7)
NEUTROS PCT: 80 %
Platelets: 211 10*3/uL (ref 150–400)
RBC: 4.22 MIL/uL (ref 3.87–5.11)
RDW: 13 % (ref 11.5–15.5)
WBC: 14.9 10*3/uL — ABNORMAL HIGH (ref 4.0–10.5)

## 2018-05-09 LAB — C-REACTIVE PROTEIN: CRP: <0.8 mg/dL (ref <1.0)

## 2018-05-09 LAB — SEDIMENTATION RATE: SED RATE: 5 mm/h (ref 0–22)

## 2018-05-09 LAB — CK: Total CK: 76 U/L (ref 38–234)

## 2018-05-09 MED ORDER — LORAZEPAM 2 MG/ML IJ SOLN
1.0000 mg | Freq: Once | INTRAMUSCULAR | Status: AC
  Administered 2018-05-09: 1 mg via INTRAVENOUS
  Filled 2018-05-09: qty 1

## 2018-05-09 MED ORDER — SODIUM CHLORIDE 0.9 % IV BOLUS
1000.0000 mL | Freq: Once | INTRAVENOUS | Status: AC
  Administered 2018-05-09: 1000 mL via INTRAVENOUS

## 2018-05-09 NOTE — ED Triage Notes (Addendum)
Pt from home with c/o feeling jittery and dehydrated. Pt states she was allergic to penicillin and was started on prednisone and benadryl on Saturday. Antibiotic was changed to doxycycline (for sinus infection). Pt states today at around 2030 she began feeling jittery. Pt states she has felt incredibly thirsty and has drank 4 gatoraids in 2 hours.  No neuro deficits noted

## 2018-05-09 NOTE — ED Provider Notes (Signed)
[facility]-EMERGENCY DEPT Provider Note   CSN: [id number] Arrival date & time: 05/08/18  2248     History   Chief Complaint Chief Complaint  Patient presents with  . jittery  . Medication Reaction    HPI Donna Kaufman is a 57 y.o. female.  Patient here with symptoms of arthralgias and UE tremor that started yesterday. 10 days ago she was started on Augmentin for a sinus infection. One week into taking Augmentin she developed generalized hives without shortness of breath, lip/tongue swelling or throat tightening. She was seen at Urgent Care on 9/21, taken off Augmentin and started on prednisone (6-day taper), IM steroid, Benadryl 25 mg every 6 hours and Doxycycline to replace the Augmentin. Yesterday 05/08/18 she developed symptoms of UE tremors, feels right hand is weak, and pain in her left shoulder and bilateral feet without joint redness or swelling. She reports having a dry mouth and felt she was dehydrated so she drank several gatorade prior to being evaluated. No history of diabetes. No nausea, vomiting or fever at any time since treatment began. Hives are reported to be persistent and intermittent. She reports similar symptoms of joint pain when having taken Macrobid in the past. She has taken Doxycycline in the past without similar symptoms.      Past Medical History:  Diagnosis Date  . HSV (herpes simplex virus) anogenital infection   . Osteopenia 04/2016   T score -2.2 FRAX 5.6%/0.3%    Patient Active Problem List   Diagnosis Date Noted  . HSV-2 infection 04/10/2015  . Spider veins of both lower extremities 06/12/2014    Past Surgical History:  Procedure Laterality Date  . CESAREAN SECTION    . TONSILLECTOMY AND ADENOIDECTOMY       OB History    Gravida  3   Para  2   Term  2   Preterm      AB  1   Living  2     SAB  1   TAB      Ectopic      Multiple      Live Births               Home Medications    Prior to  Admission medications   Medication Sig Start Date End Date Taking? Authorizing Provider  cycloSPORINE (RESTASIS) 0.05 % ophthalmic emulsion 1 drop 2 (two) times daily.    [provider]  Estradiol 10 MCG TABS vaginal tablet Place 1 tablet (10 mcg total) vaginally 2 (two) times a week. 05/09/17   Fontaine, Nadyne Coombes, MD  valACYclovir (VALTREX) 500 MG tablet TAKE 2 TIMES A DAY FOR 3-5 DAYS 11/24/17   Fontaine, Nadyne Coombes, MD    Family History Family History  Problem Relation Age of Onset  . Hypertension Father   . Diabetes Father   . Cancer Father        Prostate  . Cancer Brother        Prostate  . Heart failure Mother        CHF    Social History Social History   Tobacco Use  . Smoking status: Never Smoker  . Smokeless tobacco: Never Used  Substance Use Topics  . Alcohol use: Yes    Alcohol/week: 4.0 standard drinks    Types: 4 Standard drinks or equivalent per week  . Drug use: No     Allergies   Macrobid [nitrofurantoin macrocrystal] and Penicillins   Review of Systems  Review of Systems  Constitutional: Negative for chills and fever.  HENT: Negative.  Negative for trouble swallowing.   Respiratory: Negative.  Negative for cough, chest tightness, shortness of breath and wheezing.   Cardiovascular: Negative.  Negative for chest pain.  Gastrointestinal: Negative.  Negative for abdominal pain, nausea and vomiting.  Endocrine: Positive for polydipsia.  Musculoskeletal: Positive for arthralgias. Negative for myalgias.  Skin: Positive for rash (Intermittent hives x 4 days).  Neurological: Positive for tremors and weakness. Negative for dizziness and light-headedness.       See HPI.     Physical Exam Updated Vital Signs BP 110/62 (BP Location: Left Arm)   Pulse 78   Temp 98.3 F (36.8 C) (Oral)   Resp 14   LMP 06/16/2012   SpO2 100%   Physical Exam  Constitutional: She is oriented to person, place, and time. She appears well-developed and  well-nourished.  HENT:  Head: Normocephalic.  Mouth/Throat: Oropharynx is clear and moist.  Eyes: Conjunctivae are normal.  Neck: Normal range of motion. Neck supple.  Cardiovascular: Normal rate, regular rhythm and intact distal pulses.  Pulmonary/Chest: Effort normal and breath sounds normal. No stridor. She has no wheezes. She has no rales.  Abdominal: Soft. Bowel sounds are normal. There is no tenderness. There is no rebound and no guarding.  Musculoskeletal: Normal range of motion.  No joint swelling or redness. Left shoulder and bilateral feet are unremarkable in appearance. Increased discomfort with movement more than palpation.   Neurological: She is alert and oriented to person, place, and time. Coordination normal.  CN's 3-12 grossly intact. Speech is clear and focused. No facial asymmetry. There are mild tremor at rest and with movement to bilateral UE's. Grip strength is 3-4/5, less than left at 5/5. LE has equal and symmetric strength. No deficits of coordination. Ambulatory without imbalance.    Skin: Skin is warm and dry. No rash noted.  Psychiatric: She has a normal mood and affect.     ED Treatments / Results  Labs (all labs ordered are listed, but only abnormal results are displayed) Labs Reviewed  CBC WITH DIFFERENTIAL/PLATELET - Abnormal; Notable for the following components:      Result Value   WBC 14.9 (*)    Neutro Abs 11.9 (*)    Monocytes Absolute 1.4 (*)    All other components within normal limits  BASIC METABOLIC PANEL - Abnormal; Notable for the following components:   Potassium 3.4 (*)    Glucose, Bld 113 (*)    All other components within normal limits  CK  SEDIMENTATION RATE  C-REACTIVE PROTEIN  CBG MONITORING, ED    EKG None  Radiology No results found.  Procedures Procedures (including critical care time)  Medications Ordered in ED Medications  sodium chloride 0.9 % bolus 1,000 mL (has no administration in time range)  LORazepam  (ATIVAN) injection 1 mg (has no administration in time range)     Initial Impression / Assessment and Plan / ED Course  I have reviewed the triage vital signs and the nursing notes.  Pertinent labs & imaging results that were available during my care of the patient were reviewed by me and considered in my medical decision making (see chart for details).     Patient here with symptoms of UE tremors, right UE weakness, arthralgias (left shouder, bilateral feet), developing over the last day. Started on Augmentin 10 days ago, changed 3 days ago after development of hives. She was started on Prednisone taper (6-day),  benadryl and doxycycline.   Labs are unremarkable with the exception of mild leukocytosis felt likely secondary to prednisone. No fever or evidence to suggest infection.   Symptoms of tremors/shakiness like due to prednisone.  The patient has had symptoms similar to current arthralgias in reaction to Macrobid in the past. Consider it possible that this is related to Doxycycline.  REcommend she stop prednisone and doxycycline, and use Benadryl if needed for any further hives. She is strongly encouraged to follow up with [facility] where she was treated before for recheck in 3 days to insure symptoms are improving. Return precautions discussed.   Final Clinical Impressions(s) / ED Diagnoses   Final diagnoses:  None   1. Adverse Medication reaction  ED Discharge Orders    None       Elpidio Anis, Cordelia Poche 05/09/18 9629    Glynn Octave, MD 05/10/18 [id number]    Glynn Octave, MD 05/11/18 [phone]

## 2018-05-09 NOTE — Discharge Instructions (Addendum)
It is recommended that you stop taking the prednisone and doxycycline. You can continue Benadryl as needed for any hives that recur. It is important that you go for recheck in 3-4 days to insure that your symptoms are improving and for recheck of your sore throat. Return to the emergency department with any new symptoms or concerns.

## 2018-05-12 ENCOUNTER — Encounter: Payer: 59 | Admitting: Gynecology

## 2018-05-18 DIAGNOSIS — M2550 Pain in unspecified joint: Secondary | ICD-10-CM | POA: Diagnosis not present

## 2018-05-30 DIAGNOSIS — F4323 Adjustment disorder with mixed anxiety and depressed mood: Secondary | ICD-10-CM | POA: Diagnosis not present

## 2018-06-20 ENCOUNTER — Encounter: Payer: BLUE CROSS/BLUE SHIELD | Admitting: Gynecology

## 2018-06-30 DIAGNOSIS — F4323 Adjustment disorder with mixed anxiety and depressed mood: Secondary | ICD-10-CM | POA: Diagnosis not present

## 2018-07-07 DIAGNOSIS — F4323 Adjustment disorder with mixed anxiety and depressed mood: Secondary | ICD-10-CM | POA: Diagnosis not present

## 2018-07-19 DIAGNOSIS — F4323 Adjustment disorder with mixed anxiety and depressed mood: Secondary | ICD-10-CM | POA: Diagnosis not present

## 2018-10-10 ENCOUNTER — Encounter: Payer: Self-pay | Admitting: Gynecology

## 2018-10-10 DIAGNOSIS — Z1231 Encounter for screening mammogram for malignant neoplasm of breast: Secondary | ICD-10-CM | POA: Diagnosis not present

## 2019-02-06 DIAGNOSIS — F4323 Adjustment disorder with mixed anxiety and depressed mood: Secondary | ICD-10-CM | POA: Diagnosis not present

## 2019-05-15 ENCOUNTER — Encounter: Payer: Self-pay | Admitting: Gynecology

## 2019-07-06 ENCOUNTER — Ambulatory Visit (INDEPENDENT_AMBULATORY_CARE_PROVIDER_SITE_OTHER): Payer: BC Managed Care – PPO | Admitting: Gynecology

## 2019-07-06 ENCOUNTER — Encounter: Payer: Self-pay | Admitting: Gynecology

## 2019-07-06 ENCOUNTER — Other Ambulatory Visit: Payer: Self-pay

## 2019-07-06 VITALS — BP 120/76 | Ht 69.0 in | Wt 156.0 lb

## 2019-07-06 DIAGNOSIS — Z13220 Encounter for screening for lipoid disorders: Secondary | ICD-10-CM | POA: Diagnosis not present

## 2019-07-06 DIAGNOSIS — M8580 Other specified disorders of bone density and structure, unspecified site: Secondary | ICD-10-CM | POA: Diagnosis not present

## 2019-07-06 DIAGNOSIS — Z01419 Encounter for gynecological examination (general) (routine) without abnormal findings: Secondary | ICD-10-CM | POA: Diagnosis not present

## 2019-07-06 MED ORDER — ESTRADIOL 10 MCG VA TABS: 1.0000 | ORAL_TABLET | VAGINAL | 4 refills | Status: DC

## 2019-07-06 NOTE — Patient Instructions (Signed)
Follow-up with the bone density as scheduled. 

## 2019-07-06 NOTE — Progress Notes (Signed)
    Donna Kaufman [birth year] [id number]        58 y.o.  [id number] for annual gynecologic exam.  Had been on Vagifem but ran out and would like to restart this due to vaginal dryness and irritation.  Had also had osteopenia DEXA 2017 T score -2.2 and was to do blood work for secondary work-up but failed to follow-up for this.  Past medical history,surgical history, problem list, medications, allergies, family history and social history were all reviewed and documented as reviewed in the EPIC chart.  ROS:  Performed with pertinent positives and negatives included in the history, assessment and plan.   Additional significant findings : None   Exam: Caryn Bee assistant Vitals:   07/06/19 1434  BP: 120/76  Weight: 156 lb (70.8 kg)  Height: 5\' 9"  (1.753 m)   Body mass index is 23.04 kg/m.  General appearance:  Normal affect, orientation and appearance. Skin: Grossly normal HEENT: Without gross lesions.  No cervical or supraclavicular adenopathy. Thyroid normal.  Lungs:  Clear without wheezing, rales or rhonchi Cardiac: RR, without RMG Abdominal:  Soft, nontender, without masses, guarding, rebound, organomegaly or hernia Breasts:  Examined lying and sitting without masses, retractions, discharge or axillary adenopathy. Pelvic:  Ext, BUS, Vagina: With atrophic changes  Cervix: With atrophic changes  Uterus: Anteverted, normal size, shape and contour, midline and mobile nontender   Adnexa: Without masses or tenderness    Anus and perineum: Normal   Rectovaginal: Normal sphincter tone without palpated masses or tenderness.    Assessment/Plan:  58 y.o. [id number] female for annual gynecologic exam.   1. Postmenopausal/atrophic genital changes.  Had been on Vagifem twice weekly but ran out.  She would like to restart.  We have discussed the risks to include absorption with systemic effects to include thrombosis, endometrial stimulation and the breast cancer issue.  She is comfortable on this  and I refilled her x1 year. 2. History of HSV with occasional outbreaks.  Has Valtrex but will call when needs more. 3. Osteopenia.  DEXA 2017 T score -2.2 FRAX 5% / 0.3%.  Had ordered secondary work-up labs previously but failed to follow-up for these.  We will go ahead and check CMP, TSH, PTH and vitamin D level.  Recommend follow-up DEXA now at 3-year interval and she agrees to schedule. 4. Colonoscopy 2016.  Repeat at their recommended interval. 5. Pap smear 2018.  Pap smear done today.  No history of abnormal Pap smears previously. 6. Mammography 09/2018.  Continue with annual mammography when due.  Breast exam normal today. 7. Health maintenance.  CBC, CMP, lipid profile, TSH, PTH and vitamin D ordered.  Follow-up for bone density.  Follow-up in 1 year for annual exam.   Anastasio Auerbach MD, 3:19 PM 07/06/2019

## 2019-07-06 NOTE — Addendum Note (Signed)
Addended by: Nelva Nay on: 07/06/2019 03:29 PM   Modules accepted: Orders

## 2019-07-09 ENCOUNTER — Other Ambulatory Visit: Payer: BC Managed Care – PPO

## 2019-07-10 LAB — PAP IG W/ RFLX HPV ASCU

## 2019-07-18 ENCOUNTER — Other Ambulatory Visit: Payer: Self-pay

## 2019-07-19 ENCOUNTER — Ambulatory Visit (INDEPENDENT_AMBULATORY_CARE_PROVIDER_SITE_OTHER): Payer: BC Managed Care – PPO

## 2019-07-19 ENCOUNTER — Other Ambulatory Visit: Payer: BC Managed Care – PPO

## 2019-07-19 DIAGNOSIS — M8580 Other specified disorders of bone density and structure, unspecified site: Secondary | ICD-10-CM

## 2019-07-19 DIAGNOSIS — M8589 Other specified disorders of bone density and structure, multiple sites: Secondary | ICD-10-CM

## 2019-07-19 DIAGNOSIS — Z13220 Encounter for screening for lipoid disorders: Secondary | ICD-10-CM | POA: Diagnosis not present

## 2019-07-19 DIAGNOSIS — Z780 Asymptomatic menopausal state: Secondary | ICD-10-CM

## 2019-07-19 DIAGNOSIS — Z01419 Encounter for gynecological examination (general) (routine) without abnormal findings: Secondary | ICD-10-CM | POA: Diagnosis not present

## 2019-07-19 DIAGNOSIS — E559 Vitamin D deficiency, unspecified: Secondary | ICD-10-CM | POA: Diagnosis not present

## 2019-07-20 ENCOUNTER — Encounter: Payer: Self-pay | Admitting: Gynecology

## 2019-07-20 ENCOUNTER — Other Ambulatory Visit: Payer: Self-pay | Admitting: Gynecology

## 2019-07-20 DIAGNOSIS — M8589 Other specified disorders of bone density and structure, multiple sites: Secondary | ICD-10-CM

## 2019-07-20 DIAGNOSIS — Z780 Asymptomatic menopausal state: Secondary | ICD-10-CM

## 2019-07-20 LAB — CBC WITH DIFFERENTIAL/PLATELET
Absolute Monocytes: 360 cells/uL (ref 200–950)
Basophils Absolute: 40 cells/uL (ref 0–200)
Basophils Relative: 0.8 %
Eosinophils Absolute: 60 cells/uL (ref 15–500)
Eosinophils Relative: 1.2 %
HCT: 42.8 % (ref 35.0–45.0)
Hemoglobin: 14.2 g/dL (ref 11.7–15.5)
Lymphs Abs: 1095 cells/uL (ref 850–3900)
MCH: 29.8 pg (ref 27.0–33.0)
MCHC: 33.2 g/dL (ref 32.0–36.0)
MCV: 89.7 fL (ref 80.0–100.0)
MPV: 10.5 fL (ref 7.5–12.5)
Monocytes Relative: 7.2 %
Neutro Abs: 3445 cells/uL (ref 1500–7800)
Neutrophils Relative %: 68.9 %
Platelets: 202 10*3/uL (ref 140–400)
RBC: 4.77 10*6/uL (ref 3.80–5.10)
RDW: 12.5 % (ref 11.0–15.0)
Total Lymphocyte: 21.9 %
WBC: 5 10*3/uL (ref 3.8–10.8)

## 2019-07-20 LAB — COMPREHENSIVE METABOLIC PANEL
AG Ratio: 2.2 (calc) (ref 1.0–2.5)
ALT: 13 U/L (ref 6–29)
AST: 20 U/L (ref 10–35)
Albumin: 4.6 g/dL (ref 3.6–5.1)
Alkaline phosphatase (APISO): 56 U/L (ref 37–153)
BUN: 17 mg/dL (ref 7–25)
CO2: 28 mmol/L (ref 20–32)
Calcium: 9.8 mg/dL (ref 8.6–10.4)
Chloride: 103 mmol/L (ref 98–110)
Creat: 0.86 mg/dL (ref 0.50–1.05)
Globulin: 2.1 g/dL (calc) (ref 1.9–3.7)
Glucose, Bld: 85 mg/dL (ref 65–99)
Potassium: 4.3 mmol/L (ref 3.5–5.3)
Sodium: 140 mmol/L (ref 135–146)
Total Bilirubin: 0.5 mg/dL (ref 0.2–1.2)
Total Protein: 6.7 g/dL (ref 6.1–8.1)

## 2019-07-20 LAB — TSH: TSH: 2.46 mIU/L (ref 0.40–4.50)

## 2019-07-20 LAB — LIPID PANEL
Cholesterol: 227 mg/dL — ABNORMAL HIGH (ref <200)
HDL: 81 mg/dL (ref >=50)
LDL Cholesterol (Calc): 131 mg/dL (calc) — ABNORMAL HIGH
Non-HDL Cholesterol (Calc): 146 mg/dL (calc) — ABNORMAL HIGH (ref <130)
Total CHOL/HDL Ratio: 2.8 (calc) (ref <5.0)
Triglycerides: 62 mg/dL (ref <150)

## 2019-07-20 LAB — VITAMIN D 25 HYDROXY (VIT D DEFICIENCY, FRACTURES): Vit D, 25-Hydroxy: 28 ng/mL — ABNORMAL LOW (ref 30–100)

## 2019-07-20 LAB — PARATHYROID HORMONE, INTACT (NO CA): PTH: 47 pg/mL (ref 14–64)

## 2019-07-25 ENCOUNTER — Other Ambulatory Visit: Payer: Self-pay

## 2019-07-25 DIAGNOSIS — E7800 Pure hypercholesterolemia, unspecified: Secondary | ICD-10-CM

## 2019-07-25 DIAGNOSIS — E559 Vitamin D deficiency, unspecified: Secondary | ICD-10-CM

## 2019-07-25 MED ORDER — VITAMIN D (ERGOCALCIFEROL) 1.25 MG (50000 UNIT) PO CAPS: 50000.0000 [IU] | ORAL_CAPSULE | ORAL | 0 refills | Status: DC

## 2019-08-29 DIAGNOSIS — F4323 Adjustment disorder with mixed anxiety and depressed mood: Secondary | ICD-10-CM | POA: Diagnosis not present

## 2019-09-04 DIAGNOSIS — F4323 Adjustment disorder with mixed anxiety and depressed mood: Secondary | ICD-10-CM | POA: Diagnosis not present

## 2019-09-11 DIAGNOSIS — F4323 Adjustment disorder with mixed anxiety and depressed mood: Secondary | ICD-10-CM | POA: Diagnosis not present

## 2019-09-25 DIAGNOSIS — F4323 Adjustment disorder with mixed anxiety and depressed mood: Secondary | ICD-10-CM | POA: Diagnosis not present

## 2019-10-09 DIAGNOSIS — F4323 Adjustment disorder with mixed anxiety and depressed mood: Secondary | ICD-10-CM | POA: Diagnosis not present

## 2019-10-23 DIAGNOSIS — F4323 Adjustment disorder with mixed anxiety and depressed mood: Secondary | ICD-10-CM | POA: Diagnosis not present

## 2019-11-01 DIAGNOSIS — F4323 Adjustment disorder with mixed anxiety and depressed mood: Secondary | ICD-10-CM | POA: Diagnosis not present

## 2019-11-08 DIAGNOSIS — F4323 Adjustment disorder with mixed anxiety and depressed mood: Secondary | ICD-10-CM | POA: Diagnosis not present

## 2019-11-26 DIAGNOSIS — H0102A Squamous blepharitis right eye, upper and lower eyelids: Secondary | ICD-10-CM | POA: Diagnosis not present

## 2019-11-26 DIAGNOSIS — H16223 Keratoconjunctivitis sicca, not specified as Sjogren's, bilateral: Secondary | ICD-10-CM | POA: Diagnosis not present

## 2019-11-26 DIAGNOSIS — H10413 Chronic giant papillary conjunctivitis, bilateral: Secondary | ICD-10-CM | POA: Diagnosis not present

## 2019-11-26 DIAGNOSIS — H0102B Squamous blepharitis left eye, upper and lower eyelids: Secondary | ICD-10-CM | POA: Diagnosis not present

## 2019-12-12 DIAGNOSIS — F4323 Adjustment disorder with mixed anxiety and depressed mood: Secondary | ICD-10-CM | POA: Diagnosis not present

## 2019-12-21 DIAGNOSIS — F4323 Adjustment disorder with mixed anxiety and depressed mood: Secondary | ICD-10-CM | POA: Diagnosis not present

## 2019-12-26 DIAGNOSIS — L814 Other melanin hyperpigmentation: Secondary | ICD-10-CM | POA: Diagnosis not present

## 2019-12-26 DIAGNOSIS — L820 Inflamed seborrheic keratosis: Secondary | ICD-10-CM | POA: Diagnosis not present

## 2020-01-11 DIAGNOSIS — F4323 Adjustment disorder with mixed anxiety and depressed mood: Secondary | ICD-10-CM | POA: Diagnosis not present

## 2020-04-29 DIAGNOSIS — F4323 Adjustment disorder with mixed anxiety and depressed mood: Secondary | ICD-10-CM | POA: Diagnosis not present

## 2020-09-26 ENCOUNTER — Ambulatory Visit (INDEPENDENT_AMBULATORY_CARE_PROVIDER_SITE_OTHER): Payer: BC Managed Care – PPO | Admitting: Obstetrics & Gynecology

## 2020-09-26 ENCOUNTER — Encounter: Payer: Self-pay | Admitting: Obstetrics & Gynecology

## 2020-09-26 ENCOUNTER — Other Ambulatory Visit: Payer: Self-pay

## 2020-09-26 VITALS — BP 118/78

## 2020-09-26 DIAGNOSIS — N898 Other specified noninflammatory disorders of vagina: Secondary | ICD-10-CM | POA: Diagnosis not present

## 2020-09-26 DIAGNOSIS — R350 Frequency of micturition: Secondary | ICD-10-CM

## 2020-09-26 DIAGNOSIS — N952 Postmenopausal atrophic vaginitis: Secondary | ICD-10-CM | POA: Diagnosis not present

## 2020-09-26 LAB — WET PREP FOR TRICH, YEAST, CLUE

## 2020-09-26 LAB — URINALYSIS, COMPLETE W/RFL CULTURE: Specific Gravity, Urine: 1.004 (ref 1.001–1.03)

## 2020-09-26 MED ORDER — CIPROFLOXACIN HCL 500 MG PO TABS: 500.0000 mg | ORAL_TABLET | Freq: Two times a day (BID) | ORAL | 0 refills | Status: DC

## 2020-09-26 MED ORDER — ESTRADIOL 10 MCG VA TABS: 1.0000 | ORAL_TABLET | VAGINAL | 0 refills | Status: DC

## 2020-09-26 MED ORDER — FLUCONAZOLE 150 MG PO TABS: 150.0000 mg | ORAL_TABLET | Freq: Every day | ORAL | 1 refills | Status: AC

## 2020-09-26 NOTE — Progress Notes (Signed)
    Donna Kaufman 11-12-1960 [id number]        60 y.o.  [id number]   RP: Urinary frequency with vagina discharge  HPI:  Urinary frequency x 2 days.  No burning with urination.  No blood in urine.  Vulvar discomfort with mild increase in vaginal discharge.  Because of the vulvar discomfort, inserted Vagifem most days this week rather than her usual twice a week.  No PMB.  No pelvic pain.  No fever.   OB History  Gravida Para Term Preterm AB Living  3 2 2   1 2   SAB IAB Ectopic Multiple Live Births  1            # Outcome Date GA Lbr Len/2nd Weight Sex Delivery Anes PTL Lv  3 SAB           2 Term           1 Term             Past medical history,surgical history, problem list, medications, allergies, family history and social history were all reviewed and documented in the EPIC chart.   Directed ROS with pertinent positives and negatives documented in the history of present illness/assessment and plan.  Exam:  Vitals:   09/26/20 1141  BP: 118/78   General appearance:  Normal  CVAT Negative bilaterally  Abdomen: Soft NT  Gynecologic exam: Vulva normal.  Speculum:  Cervix/Vagina normal.  Mild increase in vaginal d/c.  Wet prep done.  U/A: Yellow cloudy.  Protein Neg.  Nitrite Neg.  WBC 10-20, RBC Neg, Bacteria moderate.  Urine Culture pending. Wet prep:  Yeast present   Assessment/Plan:  60 y.o. 60   1. Urinary frequency Probable acute cystitis per clinical presentation and urine analysis.  Decision to treat with ciprofloxacin 500 mg 1 tablet per mouth twice a day for 7 days.  Usage reviewed and prescription sent to pharmacy.  Pending urine culture. - Urinalysis,Complete w/RFL Culture  2. Vaginal discharge Yeasts present on wet prep.  Will treat with fluconazole 150 mg daily for 3 days.  Patient may take 1 tablet now to control symptoms and finish the treatment after the antibiotics. - WET PREP FOR TRICH, YEAST, CLUE  3. Postmenopausal atrophic vaginitis Vagifem  prescription sent to pharmacy.  Patient will schedule her annual [place] exam.  Other orders - ciprofloxacin (CIPRO) 500 MG tablet; Take 1 tablet (500 mg total) by mouth 2 (two) times daily. - fluconazole (DIFLUCAN) 150 MG tablet; Take 1 tablet (150 mg total) by mouth daily for 3 days. - Estradiol 10 MCG TABS vaginal tablet; Place 1 tablet (10 mcg total) vaginally 2 (two) times a week.  [id number] MD, 11:59 AM 09/26/2020

## 2020-09-28 LAB — URINALYSIS, COMPLETE W/RFL CULTURE
Glucose, UA: NEGATIVE
Ketones, ur: NEGATIVE
Protein, ur: NEGATIVE
RBC / HPF: NONE SEEN /HPF (ref 0–2)
pH: 6 (ref 5.0–8.0)

## 2020-09-28 LAB — URINE CULTURE

## 2020-09-29 DIAGNOSIS — E7800 Pure hypercholesterolemia, unspecified: Secondary | ICD-10-CM | POA: Diagnosis not present

## 2020-09-29 DIAGNOSIS — R0789 Other chest pain: Secondary | ICD-10-CM | POA: Diagnosis not present

## 2020-09-29 LAB — URINALYSIS, COMPLETE W/RFL CULTURE
Bilirubin Urine: NEGATIVE
Hgb urine dipstick: NEGATIVE
Hyaline Cast: NONE SEEN /LPF
Nitrites, Initial: NEGATIVE

## 2020-09-29 LAB — URINE CULTURE
MICRO NUMBER:: 11524658
SPECIMEN QUALITY:: ADEQUATE

## 2020-09-29 LAB — CULTURE INDICATED

## 2020-10-10 ENCOUNTER — Other Ambulatory Visit: Payer: Self-pay | Admitting: Obstetrics & Gynecology

## 2020-10-13 NOTE — Telephone Encounter (Signed)
annual exam scheduled on 11/21/20

## 2020-11-21 ENCOUNTER — Ambulatory Visit: Payer: BC Managed Care – PPO | Admitting: Obstetrics & Gynecology

## 2021-01-23 ENCOUNTER — Other Ambulatory Visit: Payer: Self-pay | Admitting: Obstetrics & Gynecology

## 2021-01-23 NOTE — Telephone Encounter (Signed)
Annual exam scheduled on 02/04/21

## 2021-02-04 ENCOUNTER — Other Ambulatory Visit: Payer: Self-pay

## 2021-02-04 ENCOUNTER — Encounter: Payer: Self-pay | Admitting: Obstetrics & Gynecology

## 2021-02-04 ENCOUNTER — Ambulatory Visit (INDEPENDENT_AMBULATORY_CARE_PROVIDER_SITE_OTHER): Payer: BC Managed Care – PPO | Admitting: Obstetrics & Gynecology

## 2021-02-04 VITALS — BP 118/70 | Ht 69.0 in | Wt 156.0 lb

## 2021-02-04 DIAGNOSIS — Z01419 Encounter for gynecological examination (general) (routine) without abnormal findings: Secondary | ICD-10-CM

## 2021-02-04 DIAGNOSIS — M8580 Other specified disorders of bone density and structure, unspecified site: Secondary | ICD-10-CM | POA: Diagnosis not present

## 2021-02-04 DIAGNOSIS — N952 Postmenopausal atrophic vaginitis: Secondary | ICD-10-CM | POA: Diagnosis not present

## 2021-02-04 MED ORDER — ESTRADIOL 10 MCG VA TABS: 1.0000 | ORAL_TABLET | VAGINAL | 4 refills | Status: DC

## 2021-02-04 NOTE — Progress Notes (Signed)
Donna Kaufman 01/21/1961 [id number]   History:    60 y.o. G3P2A1L2 Married  RP:  Established patient presenting for annual gyn exam   HPI: Postmenopause, well on no systemic HRT.  No PMB.  No pelvic pain.  Well on Vagifem twice a week.  Rarely sexually active.  Urine/BMs normal.  Breasts normal.  Osteopenia BD 07/2019.  BMI 23.04.  Colono scheduled.  Past medical history,surgical history, family history and social history were all reviewed and documented in the EPIC chart.  Gynecologic History Patient's last menstrual period was 06/16/2012.  Obstetric History OB History  Gravida Para Term Preterm AB Living  3 2 2   1 2   SAB IAB Ectopic Multiple Live Births  1            # Outcome Date GA Lbr Len/2nd Weight Sex Delivery Anes PTL Lv  3 SAB           2 Term           1 Term              ROS: A ROS was performed and pertinent positives and negatives are included in the history.  GENERAL: No fevers or chills. HEENT: No change in vision, no earache, sore throat or sinus congestion. NECK: No pain or stiffness. CARDIOVASCULAR: No chest pain or pressure. No palpitations. PULMONARY: No shortness of breath, cough or wheeze. GASTROINTESTINAL: No abdominal pain, nausea, vomiting or diarrhea, melena or bright red blood per rectum. GENITOURINARY: No urinary frequency, urgency, hesitancy or dysuria. MUSCULOSKELETAL: No joint or muscle pain, no back pain, no recent trauma. DERMATOLOGIC: No rash, no itching, no lesions. ENDOCRINE: No polyuria, polydipsia, no heat or cold intolerance. No recent change in weight. HEMATOLOGICAL: No anemia or easy bruising or bleeding. NEUROLOGIC: No headache, seizures, numbness, tingling or weakness. PSYCHIATRIC: No depression, no loss of interest in normal activity or change in sleep pattern.     Exam:   BP 118/70   Ht 5\' 9"  (1.753 m)   Wt 156 lb (70.8 kg)   LMP 06/16/2012   BMI 23.04 kg/m   Body mass index is 23.04 kg/m.  General appearance : Well  developed well nourished female. No acute distress HEENT: Eyes: no retinal hemorrhage or exudates,  Neck supple, trachea midline, no carotid bruits, no thyroidmegaly Lungs: Clear to auscultation, no rhonchi or wheezes, or rib retractions  Heart: Regular rate and rhythm, no murmurs or gallops Breast:Examined in sitting and supine position were symmetrical in appearance, no palpable masses or tenderness,  no skin retraction, no nipple inversion, no nipple discharge, no skin discoloration, no axillary or supraclavicular lymphadenopathy Abdomen: no palpable masses or tenderness, no rebound or guarding Extremities: no edema or skin discoloration or tenderness  Pelvic: Vulva: Normal             Vagina: No gross lesions or discharge  Cervix: No gross lesions or discharge  Uterus  AV, normal size, shape and consistency, non-tender and mobile  Adnexa  Without masses or tenderness  Anus: Normal   Assessment/Plan:  60 y.o. female for annual exam   1. Well female exam with routine gynecological exam Normal gynecologic exam.  No indication for Pap test this year, Pap - November 2020.  Breast exam normal.  Will schedule a screening mammogram now.  Colonoscopy scheduled.  Health labs with family physician.  Good body mass index at 23.04.  Continue with fitness and healthy nutrition.  2. Postmenopausal atrophic vaginitis Well on  estradiol tablet vaginally twice a week.  No contraindication to continue.  Prescription sent to pharmacy.  3. Osteopenia, unspecified location Repeat bone density at 2 years in December 2022.  Continue with vitamin D supplements, calcium intake of 1.5 g/day total and regular weightbearing physical activities. - DG Bone Density; Future  Other orders - Estradiol 10 MCG TABS vaginal tablet; Place 1 tablet (10 mcg total) vaginally 2 (two) times a week.    Genia Del MD, 2:19 PM 02/04/2021

## 2021-02-08 ENCOUNTER — Encounter: Payer: Self-pay | Admitting: Obstetrics & Gynecology

## 2021-02-11 DIAGNOSIS — F4323 Adjustment disorder with mixed anxiety and depressed mood: Secondary | ICD-10-CM | POA: Diagnosis not present

## 2021-02-26 ENCOUNTER — Ambulatory Visit (INDEPENDENT_AMBULATORY_CARE_PROVIDER_SITE_OTHER): Payer: BC Managed Care – PPO | Admitting: Obstetrics & Gynecology

## 2021-02-26 ENCOUNTER — Other Ambulatory Visit: Payer: Self-pay

## 2021-02-26 ENCOUNTER — Encounter: Payer: Self-pay | Admitting: Obstetrics & Gynecology

## 2021-02-26 VITALS — BP 110/72 | HR 71 | Temp 97.8°F | Resp 16

## 2021-02-26 DIAGNOSIS — N898 Other specified noninflammatory disorders of vagina: Secondary | ICD-10-CM

## 2021-02-26 DIAGNOSIS — R3915 Urgency of urination: Secondary | ICD-10-CM | POA: Diagnosis not present

## 2021-02-26 DIAGNOSIS — R350 Frequency of micturition: Secondary | ICD-10-CM | POA: Diagnosis not present

## 2021-02-26 LAB — WET PREP FOR TRICH, YEAST, CLUE

## 2021-02-26 MED ORDER — FLUCONAZOLE 150 MG PO TABS: 150.0000 mg | ORAL_TABLET | Freq: Every day | ORAL | 2 refills | Status: AC

## 2021-02-26 MED ORDER — CIPROFLOXACIN HCL 500 MG PO TABS: 500.0000 mg | ORAL_TABLET | Freq: Two times a day (BID) | ORAL | 0 refills | Status: AC

## 2021-02-26 NOTE — Progress Notes (Signed)
    Donna Kaufman [birth year] [id number]        60 y.o.  [id number]   RP: Urinary urgency and vaginal discharge x a few days  HPI: Urinary urgency and vaginal discharge x a few days.  Feels mildly bloated.  Treated with Monistat x 3 days.  Started drinking more green tea recently.   OB History  Gravida Para Term Preterm AB Living  3 2 2   1 2   SAB IAB Ectopic Multiple Live Births  1            # Outcome Date GA Lbr Len/2nd Weight Sex Delivery Anes PTL Lv  3 SAB           2 Term           1 Term             Past medical history,surgical history, problem list, medications, allergies, family history and social history were all reviewed and documented in the EPIC chart.   Directed ROS with pertinent positives and negatives documented in the history of present illness/assessment and plan.  Exam:  Vitals:   02/26/21 1542  BP: 110/72  Pulse: 71  Resp: 16  Temp: 97.8 F (36.6 C)  TempSrc: Oral   General appearance:  Normal  CVAT Neg bilaterally  Abdomen: Normal  Gynecologic exam: Vulva normal.  Speculum:  Cervix/Vagina normal.  Wet prep done.  U/A: Cloudy yellow, nitrites negative, protein negative, white blood cells 0-5, red blood cells negative, bacteria few.  Urine culture pending. Wet prep: Neg   Assessment/Plan:  60 y.o. 76   1. Urinary urgency Urine analysis with mild disturbance.  Urine culture pending.  Prescription for Cipro sent to pharmacy.  Patient will start if worsening symptoms.  Recommend pushing water intake.  We will stop drinking green tea until symptoms resolve. - Urinalysis,Complete w/RFL Culture  2. Vaginal discharge Treated yeast vaginitis with Monistat.  If symptoms persist, will try fluconazole 1 tablet per mouth daily for 3 days.  Prescription sent to pharmacy. - WET PREP FOR TRICH, YEAST, CLUE  Other orders - loratadine (CLARITIN) 10 MG tablet; Take 10 mg by mouth as needed for allergies. - VITAMIN D PO; Take by mouth. - Omega-3 Fatty  Acids (FISH OIL PO); Take by mouth. - Miconazole Nitrate (MONISTAT 3 VA); Place vaginally. - fluconazole (DIFLUCAN) 150 MG tablet; Take 1 tablet (150 mg total) by mouth daily for 3 days. - ciprofloxacin (CIPRO) 500 MG tablet; Take 1 tablet (500 mg total) by mouth 2 (two) times daily for 7 days.   [id number] MD, 3:54 PM 02/26/2021

## 2021-02-28 LAB — URINE CULTURE
MICRO NUMBER:: 12119375
SPECIMEN QUALITY:: ADEQUATE

## 2021-02-28 LAB — URINALYSIS, COMPLETE W/RFL CULTURE
Bilirubin Urine: NEGATIVE
Glucose, UA: NEGATIVE
Hgb urine dipstick: NEGATIVE
Hyaline Cast: NONE SEEN /LPF
Ketones, ur: NEGATIVE
Leukocyte Esterase: NEGATIVE
Nitrites, Initial: NEGATIVE
Protein, ur: NEGATIVE
RBC / HPF: NONE SEEN /HPF (ref 0–2)
Specific Gravity, Urine: 1.01 (ref 1.001–1.035)
pH: 7 (ref 5.0–8.0)

## 2021-02-28 LAB — CULTURE INDICATED

## 2021-03-01 ENCOUNTER — Encounter: Payer: Self-pay | Admitting: Obstetrics & Gynecology

## 2021-03-03 DIAGNOSIS — F4323 Adjustment disorder with mixed anxiety and depressed mood: Secondary | ICD-10-CM | POA: Diagnosis not present

## 2021-03-06 ENCOUNTER — Encounter: Payer: Self-pay | Admitting: Obstetrics & Gynecology

## 2021-03-06 DIAGNOSIS — Z1231 Encounter for screening mammogram for malignant neoplasm of breast: Secondary | ICD-10-CM | POA: Diagnosis not present

## 2021-04-08 DIAGNOSIS — F4323 Adjustment disorder with mixed anxiety and depressed mood: Secondary | ICD-10-CM | POA: Diagnosis not present

## 2021-07-16 DIAGNOSIS — L814 Other melanin hyperpigmentation: Secondary | ICD-10-CM | POA: Diagnosis not present

## 2021-07-16 DIAGNOSIS — L821 Other seborrheic keratosis: Secondary | ICD-10-CM | POA: Diagnosis not present

## 2021-07-16 DIAGNOSIS — L720 Epidermal cyst: Secondary | ICD-10-CM | POA: Diagnosis not present

## 2021-07-17 DIAGNOSIS — D122 Benign neoplasm of ascending colon: Secondary | ICD-10-CM | POA: Diagnosis not present

## 2021-07-17 DIAGNOSIS — K5730 Diverticulosis of large intestine without perforation or abscess without bleeding: Secondary | ICD-10-CM | POA: Diagnosis not present

## 2021-07-17 DIAGNOSIS — Z86010 Personal history of colonic polyps: Secondary | ICD-10-CM | POA: Diagnosis not present

## 2021-07-17 DIAGNOSIS — D123 Benign neoplasm of transverse colon: Secondary | ICD-10-CM | POA: Diagnosis not present

## 2021-09-10 DIAGNOSIS — F4323 Adjustment disorder with mixed anxiety and depressed mood: Secondary | ICD-10-CM | POA: Diagnosis not present

## 2021-09-18 DIAGNOSIS — F4323 Adjustment disorder with mixed anxiety and depressed mood: Secondary | ICD-10-CM | POA: Diagnosis not present

## 2021-09-25 DIAGNOSIS — F4323 Adjustment disorder with mixed anxiety and depressed mood: Secondary | ICD-10-CM | POA: Diagnosis not present

## 2021-10-06 DIAGNOSIS — F4323 Adjustment disorder with mixed anxiety and depressed mood: Secondary | ICD-10-CM | POA: Diagnosis not present

## 2021-10-23 DIAGNOSIS — F4323 Adjustment disorder with mixed anxiety and depressed mood: Secondary | ICD-10-CM | POA: Diagnosis not present

## 2021-11-13 DIAGNOSIS — F4323 Adjustment disorder with mixed anxiety and depressed mood: Secondary | ICD-10-CM | POA: Diagnosis not present

## 2021-12-25 DIAGNOSIS — F4323 Adjustment disorder with mixed anxiety and depressed mood: Secondary | ICD-10-CM | POA: Diagnosis not present

## 2022-01-08 DIAGNOSIS — F4323 Adjustment disorder with mixed anxiety and depressed mood: Secondary | ICD-10-CM | POA: Diagnosis not present

## 2022-02-24 ENCOUNTER — Other Ambulatory Visit: Payer: Self-pay | Admitting: Obstetrics & Gynecology

## 2022-02-24 NOTE — Telephone Encounter (Signed)
Last annual exam 01/2021 Scheduled on 04/01/22

## 2022-02-25 DIAGNOSIS — F4323 Adjustment disorder with mixed anxiety and depressed mood: Secondary | ICD-10-CM | POA: Diagnosis not present

## 2022-04-01 ENCOUNTER — Other Ambulatory Visit (HOSPITAL_COMMUNITY)
Admission: RE | Admit: 2022-04-01 | Discharge: 2022-04-01 | Disposition: A | Discharge status: Home / Self Care | Payer: BLUE CROSS/BLUE SHIELD | Source: Ambulatory Visit | Attending: Obstetrics & Gynecology | Admitting: Obstetrics & Gynecology

## 2022-04-01 ENCOUNTER — Encounter: Payer: Self-pay | Admitting: Obstetrics & Gynecology

## 2022-04-01 ENCOUNTER — Ambulatory Visit (INDEPENDENT_AMBULATORY_CARE_PROVIDER_SITE_OTHER): Payer: BC Managed Care – PPO | Admitting: Obstetrics & Gynecology

## 2022-04-01 VITALS — BP 108/76 | HR 80 | Resp 12 | Ht 68.5 in | Wt 155.0 lb

## 2022-04-01 DIAGNOSIS — M8580 Other specified disorders of bone density and structure, unspecified site: Secondary | ICD-10-CM | POA: Diagnosis not present

## 2022-04-01 DIAGNOSIS — Z01419 Encounter for gynecological examination (general) (routine) without abnormal findings: Secondary | ICD-10-CM | POA: Insufficient documentation

## 2022-04-01 DIAGNOSIS — B3731 Acute candidiasis of vulva and vagina: Secondary | ICD-10-CM

## 2022-04-01 DIAGNOSIS — N952 Postmenopausal atrophic vaginitis: Secondary | ICD-10-CM

## 2022-04-01 MED ORDER — ESTRADIOL 10 MCG VA TABS: 1.0000 | ORAL_TABLET | VAGINAL | 4 refills | Status: DC

## 2022-04-01 MED ORDER — FLUCONAZOLE 150 MG PO TABS: 150.0000 mg | ORAL_TABLET | ORAL | 2 refills | Status: AC

## 2022-04-01 MED ORDER — ESTRADIOL 10 MCG VA TABS
1.0000 | ORAL_TABLET | VAGINAL | 4 refills | Status: DC
Start: 2022-04-01 — End: 2023-05-05

## 2022-04-01 NOTE — Progress Notes (Addendum)
Donna Kaufman 1960/09/10 [id number]   History:    61 y.o.  G3P2A1L2 Married   RP:  Established patient presenting for annual gyn exam    HPI: Postmenopause, well on no systemic HRT.  No PMB.  No pelvic pain.  Mild vaginal itching currently.  Rarely sexually active.  Improved vaginal dryness with Vagifem twice a week. No h/o abnormal Pap.  Pap 06/2019 Neg.  Pap reflex today.  Urine/BMs normal. Breasts normal. Mammo Neg 02/2021, scheduled at [facility] 04/[facility].  Osteopenia BD 07/2019. Will repeat a BD here now.  BMI 23.22.  Colono 2022.  F/U Fasting Health Labs here.   Past medical history,surgical history, family history and social history were all reviewed and documented in the EPIC chart.  Gynecologic History Patient's last menstrual period was 06/16/2012.  Obstetric History OB History  Gravida Para Term Preterm AB Living  3 2 2   1 2   SAB IAB Ectopic Multiple Live Births  1            # Outcome Date GA Lbr Len/2nd Weight Sex Delivery Anes PTL Lv  3 SAB           2 Term           1 Term              ROS: A ROS was performed and pertinent positives and negatives are included in the history. GENERAL: No fevers or chills. HEENT: No change in vision, no earache, sore throat or sinus congestion. NECK: No pain or stiffness. CARDIOVASCULAR: No chest pain or pressure. No palpitations. PULMONARY: No shortness of breath, cough or wheeze. GASTROINTESTINAL: No abdominal pain, nausea, vomiting or diarrhea, melena or bright red blood per rectum. GENITOURINARY: No urinary frequency, urgency, hesitancy or dysuria. MUSCULOSKELETAL: No joint or muscle pain, no back pain, no recent trauma. DERMATOLOGIC: No rash, no itching, no lesions. ENDOCRINE: No polyuria, polydipsia, no heat or cold intolerance. No recent change in weight. HEMATOLOGICAL: No anemia or easy bruising or bleeding. NEUROLOGIC: No headache, seizures, numbness, tingling or weakness. PSYCHIATRIC: No depression, no loss of interest in normal  activity or change in sleep pattern.     Exam:   BP 108/76   Pulse 80   Resp 12   Ht 5' 8.5" (1.74 m)   Wt 155 lb (70.3 kg)   LMP 06/16/2012   BMI 23.22 kg/m   Body mass index is 23.22 kg/m.  General appearance : Well developed well nourished female. No acute distress HEENT: Eyes: no retinal hemorrhage or exudates,  Neck supple, trachea midline, no carotid bruits, no thyroidmegaly Lungs: Clear to auscultation, no rhonchi or wheezes, or rib retractions  Heart: Regular rate and rhythm, no murmurs or gallops Breast:Examined in sitting and supine position were symmetrical in appearance, no palpable masses or tenderness,  no skin retraction, no nipple inversion, no nipple discharge, no skin discoloration, no axillary or supraclavicular lymphadenopathy Abdomen: no palpable masses or tenderness, no rebound or guarding Extremities: no edema or skin discoloration or tenderness  Pelvic: Vulva: Normal             Vagina: No gross lesions. Clumpy discharge c/w yeast.  Cervix: No gross lesions or discharge.  Pap reflex done.  Uterus  AV, normal size, shape and consistency, non-tender and mobile  Adnexa  Without masses or tenderness  Anus: Normal   Assessment/Plan:  61 y.o. female for annual exam   1. Encounter for routine gynecological examination with Papanicolaou smear of  cervix Postmenopause, well on no systemic HRT.  No PMB.  No pelvic pain.  Mild vaginal itching currently.  Rarely sexually active.  Improved vaginal dryness with Vagifem twice a week. No h/o abnormal Pap.  Pap 06/2019 Neg.  Pap reflex today.  Urine/BMs normal. Breasts normal. Mammo Neg 02/2021, scheduled at [facility] 04/2022.  Osteopenia BD 07/2019. Will repeat a BD here now.  BMI 23.22. Colono 2022.  F/U Fasting Health Labs here. - CBC; Future - Comp Met (CMET); Future - TSH; Future - Vitamin D (25 hydroxy); Future - Lipid Profile; Future - Cytology - PAP( [place])  2. Postmenopausal atrophic  vaginitis Postmenopause, well on no systemic HRT.  No PMB.  No pelvic pain.  Mild vaginal itching currently.  Rarely sexually active.  Improved vaginal dryness with Vagifem twice a week.  Vagifem generic represcribed.  3. Osteopenia, unspecified location Osteopenia BD 07/2019. Will repeat a BD here now. Vit D, Ca++ 1.5 g/d total, regular walking. - DG Bone Density; Future  4. Yeast vaginitis Will treat with Fluconazole.  Usage reviewed, prescription sent to pharmacy.  Other orders - Multiple Vitamin (MULTI VITAMIN) TABS; 1 tablet - fluconazole (DIFLUCAN) 150 MG tablet; Take 1 tablet (150 mg total) by mouth every other day for 3 days. - Estradiol 10 MCG TABS vaginal tablet; Place 1 tablet (10 mcg total) vaginally 2 (two) times a week.   Princess Bruins MD, 11:45 AM 04/01/2022

## 2022-04-01 NOTE — Addendum Note (Signed)
Addended by: Genia Del on: 04/01/2022 12:10 PM   Modules accepted: Orders

## 2022-04-01 NOTE — Addendum Note (Signed)
Addended by: Genia Del on: 04/01/2022 03:37 PM   Modules accepted: Orders

## 2022-04-02 DIAGNOSIS — F4323 Adjustment disorder with mixed anxiety and depressed mood: Secondary | ICD-10-CM | POA: Diagnosis not present

## 2022-04-06 DIAGNOSIS — L820 Inflamed seborrheic keratosis: Secondary | ICD-10-CM | POA: Diagnosis not present

## 2022-04-08 ENCOUNTER — Telehealth: Payer: Self-pay | Admitting: *Deleted

## 2022-04-08 LAB — CYTOLOGY - PAP
Comment: NEGATIVE
Diagnosis: UNDETERMINED — AB
High risk HPV: NEGATIVE

## 2022-04-08 NOTE — Telephone Encounter (Signed)
Message left to return call to Aliviana Burdell at [phone].   

## 2022-04-08 NOTE — Telephone Encounter (Signed)
-----   Message from Genia Del, MD sent at 04/08/2022  1:59 PM EDT ----- ASCUS/HPV HR Neg.  Repeat Pap in 1 year.

## 2022-04-27 ENCOUNTER — Other Ambulatory Visit: Payer: Self-pay | Admitting: Obstetrics & Gynecology

## 2022-04-27 ENCOUNTER — Other Ambulatory Visit: Payer: BC Managed Care – PPO

## 2022-04-27 ENCOUNTER — Ambulatory Visit (INDEPENDENT_AMBULATORY_CARE_PROVIDER_SITE_OTHER): Payer: BC Managed Care – PPO

## 2022-04-27 DIAGNOSIS — E559 Vitamin D deficiency, unspecified: Secondary | ICD-10-CM | POA: Diagnosis not present

## 2022-04-27 DIAGNOSIS — M8589 Other specified disorders of bone density and structure, multiple sites: Secondary | ICD-10-CM

## 2022-04-27 DIAGNOSIS — Z780 Asymptomatic menopausal state: Secondary | ICD-10-CM | POA: Diagnosis not present

## 2022-04-27 DIAGNOSIS — Z13820 Encounter for screening for osteoporosis: Secondary | ICD-10-CM | POA: Diagnosis not present

## 2022-04-27 DIAGNOSIS — Z01419 Encounter for gynecological examination (general) (routine) without abnormal findings: Secondary | ICD-10-CM

## 2022-04-27 DIAGNOSIS — E785 Hyperlipidemia, unspecified: Secondary | ICD-10-CM | POA: Diagnosis not present

## 2022-04-27 DIAGNOSIS — M8580 Other specified disorders of bone density and structure, unspecified site: Secondary | ICD-10-CM

## 2022-04-27 DIAGNOSIS — Z0000 Encounter for general adult medical examination without abnormal findings: Secondary | ICD-10-CM | POA: Diagnosis not present

## 2022-04-28 LAB — COMPREHENSIVE METABOLIC PANEL
AG Ratio: 1.9 (calc) (ref 1.0–2.5)
ALT: 13 U/L (ref 6–29)
AST: 21 U/L (ref 10–35)
Albumin: 4.6 g/dL (ref 3.6–5.1)
Alkaline phosphatase (APISO): 62 U/L (ref 37–153)
BUN: 17 mg/dL (ref 7–25)
CO2: 26 mmol/L (ref 20–32)
Calcium: 9.9 mg/dL (ref 8.6–10.4)
Chloride: 105 mmol/L (ref 98–110)
Creat: 0.84 mg/dL (ref 0.50–1.05)
Globulin: 2.4 g/dL (calc) (ref 1.9–3.7)
Glucose, Bld: 87 mg/dL (ref 65–99)
Potassium: 4.2 mmol/L (ref 3.5–5.3)
Sodium: 141 mmol/L (ref 135–146)
Total Bilirubin: 0.6 mg/dL (ref 0.2–1.2)
Total Protein: 7 g/dL (ref 6.1–8.1)

## 2022-04-28 LAB — CBC
HCT: 43.4 % (ref 35.0–45.0)
Hemoglobin: 14.7 g/dL (ref 11.7–15.5)
MCH: 30.2 pg (ref 27.0–33.0)
MCHC: 33.9 g/dL (ref 32.0–36.0)
MCV: 89.3 fL (ref 80.0–100.0)
MPV: 10.5 fL (ref 7.5–12.5)
Platelets: 233 10*3/uL (ref 140–400)
RBC: 4.86 10*6/uL (ref 3.80–5.10)
RDW: 12.5 % (ref 11.0–15.0)
WBC: 6.1 10*3/uL (ref 3.8–10.8)

## 2022-04-28 LAB — TSH: TSH: 1.86 mIU/L (ref 0.40–4.50)

## 2022-04-28 LAB — LIPID PANEL
Cholesterol: 238 mg/dL — ABNORMAL HIGH (ref <200)
HDL: 89 mg/dL (ref >=50)
LDL Cholesterol (Calc): 134 mg/dL (calc) — ABNORMAL HIGH
Non-HDL Cholesterol (Calc): 149 mg/dL (calc) — ABNORMAL HIGH (ref <130)
Total CHOL/HDL Ratio: 2.7 (calc) (ref <5.0)
Triglycerides: 60 mg/dL (ref <150)

## 2022-04-28 LAB — VITAMIN D 25 HYDROXY (VIT D DEFICIENCY, FRACTURES): Vit D, 25-Hydroxy: 38 ng/mL (ref 30–100)

## 2022-05-19 ENCOUNTER — Encounter: Payer: Self-pay | Admitting: Obstetrics & Gynecology

## 2022-05-28 DIAGNOSIS — F4323 Adjustment disorder with mixed anxiety and depressed mood: Secondary | ICD-10-CM | POA: Diagnosis not present

## 2022-07-09 DIAGNOSIS — J019 Acute sinusitis, unspecified: Secondary | ICD-10-CM | POA: Diagnosis not present

## 2022-07-09 DIAGNOSIS — R059 Cough, unspecified: Secondary | ICD-10-CM | POA: Diagnosis not present

## 2022-07-29 NOTE — Telephone Encounter (Signed)
See result note.  

## 2022-09-17 DIAGNOSIS — H43812 Vitreous degeneration, left eye: Secondary | ICD-10-CM | POA: Diagnosis not present

## 2022-09-17 DIAGNOSIS — H0102A Squamous blepharitis right eye, upper and lower eyelids: Secondary | ICD-10-CM | POA: Diagnosis not present

## 2022-09-17 DIAGNOSIS — H10413 Chronic giant papillary conjunctivitis, bilateral: Secondary | ICD-10-CM | POA: Diagnosis not present

## 2022-09-17 DIAGNOSIS — H2513 Age-related nuclear cataract, bilateral: Secondary | ICD-10-CM | POA: Diagnosis not present

## 2022-10-13 DIAGNOSIS — F4323 Adjustment disorder with mixed anxiety and depressed mood: Secondary | ICD-10-CM | POA: Diagnosis not present

## 2022-10-26 DIAGNOSIS — F4323 Adjustment disorder with mixed anxiety and depressed mood: Secondary | ICD-10-CM | POA: Diagnosis not present

## 2023-02-15 DIAGNOSIS — R6889 Other general symptoms and signs: Secondary | ICD-10-CM | POA: Diagnosis not present

## 2023-03-09 DIAGNOSIS — L720 Epidermal cyst: Secondary | ICD-10-CM | POA: Diagnosis not present

## 2023-03-09 DIAGNOSIS — L821 Other seborrheic keratosis: Secondary | ICD-10-CM | POA: Diagnosis not present

## 2023-03-09 DIAGNOSIS — L814 Other melanin hyperpigmentation: Secondary | ICD-10-CM | POA: Diagnosis not present

## 2023-03-09 DIAGNOSIS — D225 Melanocytic nevi of trunk: Secondary | ICD-10-CM | POA: Diagnosis not present

## 2023-03-29 DIAGNOSIS — M5010 Cervical disc disorder with radiculopathy, unspecified cervical region: Secondary | ICD-10-CM | POA: Diagnosis not present

## 2023-05-05 ENCOUNTER — Other Ambulatory Visit: Payer: Self-pay

## 2023-05-05 DIAGNOSIS — N952 Postmenopausal atrophic vaginitis: Secondary | ICD-10-CM

## 2023-05-05 NOTE — Telephone Encounter (Signed)
Pt LVM in triage line stating she needs refills on vagifem. However, requests paper copies due to receiving rx from a pharmacy in [place].  Med refill request: Vagifem Last AEX: 04/01/2022-ML Next AEX: 06/24/2023-EB Last MMG (if hormonal med): 05/17/2022-neg birads 1 Refill authorized: rx pend.

## 2023-05-06 MED ORDER — ESTRADIOL 10 MCG VA TABS: 1.0000 | ORAL_TABLET | VAGINAL | 0 refills | Status: DC

## 2023-05-06 NOTE — Telephone Encounter (Signed)
Pt notified that script is ready and waiting for her to be picked up at [facility].  Pt voiced understanding and appreciation for call.

## 2023-05-23 DIAGNOSIS — Z1231 Encounter for screening mammogram for malignant neoplasm of breast: Secondary | ICD-10-CM | POA: Diagnosis not present

## 2023-05-26 ENCOUNTER — Encounter: Payer: Self-pay | Admitting: Radiology

## 2023-05-26 ENCOUNTER — Encounter: Payer: Self-pay | Admitting: Obstetrics and Gynecology

## 2023-06-06 ENCOUNTER — Encounter: Payer: Self-pay | Admitting: Obstetrics and Gynecology

## 2023-06-06 DIAGNOSIS — R928 Other abnormal and inconclusive findings on diagnostic imaging of breast: Secondary | ICD-10-CM | POA: Diagnosis not present

## 2023-06-06 DIAGNOSIS — N6310 Unspecified lump in the right breast, unspecified quadrant: Secondary | ICD-10-CM | POA: Diagnosis not present

## 2023-06-24 ENCOUNTER — Ambulatory Visit: Payer: BC Managed Care – PPO | Admitting: Obstetrics and Gynecology

## 2023-07-26 ENCOUNTER — Ambulatory Visit (INDEPENDENT_AMBULATORY_CARE_PROVIDER_SITE_OTHER): Payer: BC Managed Care – PPO | Admitting: Obstetrics and Gynecology

## 2023-07-26 ENCOUNTER — Encounter: Payer: Self-pay | Admitting: Obstetrics and Gynecology

## 2023-07-26 VITALS — BP 116/70 | HR 73 | Ht 68.5 in | Wt 164.0 lb

## 2023-07-26 DIAGNOSIS — E2839 Other primary ovarian failure: Secondary | ICD-10-CM

## 2023-07-26 DIAGNOSIS — Z01419 Encounter for gynecological examination (general) (routine) without abnormal findings: Secondary | ICD-10-CM

## 2023-07-26 DIAGNOSIS — N952 Postmenopausal atrophic vaginitis: Secondary | ICD-10-CM

## 2023-07-26 DIAGNOSIS — Z1211 Encounter for screening for malignant neoplasm of colon: Secondary | ICD-10-CM

## 2023-07-26 MED ORDER — ESTRADIOL 10 MCG VA TABS: 1.0000 | ORAL_TABLET | VAGINAL | 0 refills | Status: DC

## 2023-07-26 NOTE — Progress Notes (Signed)
62 y.o. y.o. female here for annual exam. Patient's last menstrual period was 06/16/2012.   [id number] Married   RP:  Established patient presenting for annual gyn exam    HPI: Postmenopause, well on no systemic HRT.  No PMB.  No pelvic pain.  Mild vaginal itching currently.  Rarely sexually active.  Improved vaginal dryness with Vagifem twice a week. No h/o abnormal Pap.  Pap 06/2019 Neg.  Pap reflex today.  Urine/BMs normal. Breasts normal. Mammo Neg 02/2021, scheduled at [facility] 04/2022.  Osteopenia BD 07/2019 AP spine -2.3. 9/23 -2.4.  BMI 23.22.  Colono 2022.  F/U [facility] here. Patient would like to be proactive about her bone health and start treatment.  Body mass index is 24.57 kg/m.      No data to display          Blood pressure 116/70, pulse 73, height 5' 8.5" (1.74 m), weight 164 lb (74.4 kg), last menstrual period 06/16/2012, SpO2 98%.     Component Value Date/Time   DIAGPAP (A) 04/01/2022 1535    - Atypical squamous cells of undetermined significance (ASC-US)   HPVHIGH Negative 04/01/2022 1535   ADEQPAP  04/01/2022 1535    Satisfactory for evaluation; transformation zone component PRESENT.    GYN HISTORY:    Component Value Date/Time   DIAGPAP (A) 04/01/2022 1535    - Atypical squamous cells of undetermined significance (ASC-US)   HPVHIGH Negative 04/01/2022 1535   ADEQPAP  04/01/2022 1535    Satisfactory for evaluation; transformation zone component PRESENT.    OB History  Gravida Para Term Preterm AB Living  3 2 2   1 2   SAB IAB Ectopic Multiple Live Births  1            # Outcome Date GA Lbr Len/2nd Weight Sex Type Anes PTL Lv  3 SAB           2 Term           1 Term             Past Medical History:  Diagnosis Date   HSV (herpes simplex virus) anogenital infection    Osteopenia 07/2019   T score -2.3 FRAX 7.3% / 0.6%    Past Surgical History:  Procedure Laterality Date   CESAREAN SECTION     TONSILLECTOMY AND ADENOIDECTOMY       Current Outpatient Medications on File Prior to Visit  Medication Sig Dispense Refill   cycloSPORINE (RESTASIS) 0.05 % ophthalmic emulsion 1 drop 2 (two) times daily.     Estradiol 10 MCG TABS vaginal tablet Place 1 tablet (10 mcg total) vaginally 2 (two) times a week. 24 tablet 0   fexofenadine (ALLEGRA) 180 MG tablet Take 180 mg by mouth daily.     Multiple Vitamin (MULTI VITAMIN) TABS 1 tablet     Omega-3 Fatty Acids (FISH OIL PO) Take by mouth.     VITAMIN D PO Take by mouth.     No current facility-administered medications on file prior to visit.    Social History   Socioeconomic History   Marital status: Married    Spouse name: Not on file   Number of children: Not on file   Years of education: Not on file   Highest education level: Not on file  Occupational History   Not on file  Tobacco Use   Smoking status: Never   Smokeless tobacco: Never  Vaping Use   Vaping status: Never Used  Substance and Sexual Activity   Alcohol use: Not Currently    Comment: occ   Drug use: No   Sexual activity: Not Currently    Birth control/protection: Other-see comments, Post-menopausal    Comment: Vasectomy-1st intercourse 62 yo-Fewer than 5 partners  Other Topics Concern   Not on file  Social History Narrative   Not on file   Social Determinants of Health   Financial Resource Strain: Not on file  Food Insecurity: Not on file  Transportation Needs: Not on file  Physical Activity: Not on file  Stress: Not on file  Social Connections: Not on file  Intimate Partner Violence: Not on file    Family History  Problem Relation Age of Onset   Hypertension Father    Diabetes Father    Cancer Father        Prostate   Cancer Brother        Prostate   Heart failure Mother        CHF     Allergies  Allergen Reactions   Macrobid [Nitrofurantoin Macrocrystal]     Headache, low-grade temperature, aching   Amoxicillin-Pot Clavulanate Rash   Penicillins Rash    Has patient  had a PCN reaction causing immediate rash, facial/tongue/throat swelling, SOB or lightheadedness with hypotension: Yes Has patient had a PCN reaction causing severe rash involving mucus membranes or skin necrosis: No Has patient had a PCN reaction that required hospitalization: No Has patient had a PCN reaction occurring within the last 10 years: No If all of the above answers are "NO", then may proceed with Cephalosporin use.       Patient's last menstrual period was Patient's last menstrual period was 06/16/2012.Marland Kitchen            Review of Systems Alls systems reviewed and are negative.     Physical Exam Constitutional:      Appearance: Normal appearance.  Genitourinary:     Vulva and urethral meatus normal.     No lesions in the vagina.     Right Labia: No rash, lesions or skin changes.    Left Labia: No lesions, skin changes or rash.    No vaginal discharge or tenderness.     No vaginal prolapse present.    No vaginal atrophy present.     Right Adnexa: not tender, not palpable and no mass present.    Left Adnexa: not tender, not palpable and no mass present.    No cervical motion tenderness or discharge.     Uterus is not enlarged, tender or irregular.  Breasts:    Right: Normal.     Left: Normal.  HENT:     Head: Normocephalic.  Neck:     Thyroid: No thyroid mass, thyromegaly or thyroid tenderness.  Cardiovascular:     Rate and Rhythm: Normal rate and regular rhythm.     Heart sounds: Normal heart sounds, S1 normal and S2 normal.  Pulmonary:     Effort: Pulmonary effort is normal.     Breath sounds: Normal breath sounds and air entry.  Abdominal:     General: There is no distension.     Palpations: Abdomen is soft. There is no mass.     Tenderness: There is no abdominal tenderness. There is no guarding or rebound.  Musculoskeletal:        General: Normal range of motion.     Cervical back: Full passive range of motion without pain, normal range of motion and neck  supple.  No tenderness.     Right lower leg: No edema.     Left lower leg: No edema.  Neurological:     Mental Status: She is alert.  Skin:    General: Skin is warm.  Psychiatric:        Mood and Affect: Mood normal.        Behavior: Behavior normal.        Thought Content: Thought content normal.  Vitals and nursing note reviewed. Exam conducted with a chaperone present.       A:         Well Woman GYN exam                             P:        Pap smear not indicated repeat 2 years Encouraged annual mammogram screening Colon cancer screening referral placed today for 2025 DXA ordered today for 2025 Patient would like to begin medication for her bones. Frax was normal. Counseled on  all options.  Repeat bone scan to be completed. She will consider options as her last one was near osteoporosis range  Encouraged healthy lifestyle practices Encouraged Vit D and Calcium and weight bearing exercises  No follow-ups on file.  Earley Favor

## 2023-07-26 NOTE — Patient Instructions (Signed)
Bone Health Bones protect organs, store calcium, anchor muscles, and support the whole body. Keeping your bones strong is important, especially as you get older. You can take actions to help keep your bones strong and healthy. Why is keeping my bones healthy important?  Keeping your bones healthy is important because your body constantly replaces bone cells. Cells get old, and new cells take their place. As we age, we lose bone cells because the body may not be able to make enough new cells to replace the old cells. The amount of bone cells and bone tissue you have is referred to as bone mass. The higher your bone mass, the stronger your bones. The aging process leads to an overall loss of bone mass in the body, which can increase the likelihood of: Broken bones. A condition in which the bones become weak and brittle (osteoporosis). A large decline in bone mass occurs in older adults. In women, it occurs about the time of menopause. What actions can I take to keep my bones healthy? Good health habits are important for maintaining healthy bones. This includes eating nutritious foods and exercising regularly. To have healthy bones, you need to get enough of the right minerals and vitamins. Most nutrition experts recommend getting these nutrients from the foods that you eat. In some cases, taking supplements may also be recommended. Doing certain types of exercise is also important for bone health. What are the nutritional recommendations for healthy bones?  Eating a well-balanced diet with plenty of calcium and vitamin D will help to protect your bones. Nutritional recommendations vary from person to person. Ask your health care provider what is healthy for you. Here are some general guidelines. Get enough calcium Calcium is the most important (essential) mineral for bone health. Most people can get enough calcium from their diet, but supplements may be recommended for people who are at risk for  osteoporosis. Good sources of calcium include: Dairy products, such as low-fat or nonfat milk, cheese, and yogurt. Dark green leafy vegetables, such as bok choy and broccoli. Foods that have calcium added to them (are fortified). Foods that may be fortified with calcium include orange juice, cereal, bread, soy beverages, and tofu products. Nuts, such as almonds. Follow these recommended amounts for daily calcium intake: Infants, 0-6 months: 200 mg. Infants, 6-12 months: 260 mg. Children, age 70-3: 700 mg. Children, age 51-8: 1,000 mg. Children, age 33-13: 1,300 mg. Teens, age 85-18: 1,300 mg. Adults, age 59-50: 1,000 mg. Adults, age 708-70: Men: 1,000 mg. Women: 1,200 mg. Adults, age 37 or older: 1,200 mg. Pregnant and breastfeeding females: Teens: 1,300 mg. Adults: 1,000 mg. Get enough vitamin D Vitamin D is the most essential vitamin for bone health. It helps the body absorb calcium. Sunlight stimulates the skin to make vitamin D, so be sure to get enough sunlight. If you live in a cold climate or you do not get outside often, your health care provider may recommend that you take vitamin D supplements. Good sources of vitamin D in your diet include: Egg yolks. Saltwater fish. Milk and cereal fortified with vitamin D. Follow these recommended amounts for daily vitamin D intake: Infants, 0-12 months: 400 international units (IU). Children and teens, age 70-18: 600 international units. Adults, age 20 or younger: 600 international units. Adults, age 70 or older: 600-1,000 international units. Get other important nutrients Other nutrients that are important for bone health include: Phosphorus. This mineral is found in meat, poultry, dairy foods, nuts, and legumes. The  recommended daily intake for adult men and adult women is 700 mg. Magnesium. This mineral is found in seeds, nuts, dark green vegetables, and legumes. The recommended daily intake for adult men is 400-420 mg. For adult women,  it is 310-320 mg. Vitamin K. This vitamin is found in green leafy vegetables. The recommended daily intake is 120 mcg for adult men and 90 mcg for adult women. What type of physical activity is best for building and maintaining healthy bones? Weight-bearing and strength-building activities are important for building and maintaining healthy bones. Weight-bearing activities cause muscles and bones to work against gravity. Strength-building activities increase the strength of the muscles that support bones. Weight-bearing and muscle-building activities include: Walking and hiking. Jogging and running. Dancing. Gym exercises. Lifting weights. Tennis and racquetball. Climbing stairs. Aerobics. Adults should get at least 30 minutes of moderate physical activity on most days. Children should get at least 60 minutes of moderate physical activity on most days. Ask your health care provider what type of exercise is best for you. How can I find out if my bone mass is low? Bone mass can be measured with an X-ray test called a bone mineral density (BMD) test. This test is recommended for all women who are age 68 or older. It may also be recommended for: Men who are age 75 or older. People who are at risk for osteoporosis because of: Having a long-term disease that weakens bones, such as kidney disease or rheumatoid arthritis. Having menopause earlier than normal. Taking medicine that weakens bones, such as steroids, thyroid hormones, or hormone treatment for breast cancer or prostate cancer. Smoking. Drinking three or more alcoholic drinks a day. Being underweight. Sedentary lifestyle. If you find that you have a low bone mass, you may be able to prevent osteoporosis or further bone loss by changing your diet and lifestyle. Where can I find more information? Bone Health & Osteoporosis Foundation: https://carlson-fletcher.info/ [employer]: www.bones.http://www.myers.net/ International Osteoporosis  Foundation: Investment banker, operational.iofbonehealth.org Summary The aging process leads to an overall loss of bone mass in the body, which can increase the likelihood of broken bones and osteoporosis. Eating a well-balanced diet with plenty of calcium and vitamin D will help to protect your bones. Weight-bearing and strength-building activities are also important for building and maintaining strong bones. Bone mass can be measured with an X-ray test called a bone mineral density (BMD) test. This information is not intended to replace advice given to you by your health care provider. Make sure you discuss any questions you have with your health care provider. Document Revised: 01/14/2021 Document Reviewed: 01/14/2021 Elsevier Patient Education  2024 Elsevier Inc.    Medications: Bisphosphonate: taken orally on a weekly basis on an empty stomach, side effects reflux and drug inconvenience. Decreases bone resorption but does not build.  Can only take for 3-5 years and you have to take a drug holiday and try another medication.  IV zoledronic acid (reclast): This is given in an IV and is a bisphoshonate once a year for 3 years then take a holiday and start a different medication.  There is about 3% bone building and has benefits on the spine  Evenity injection: 15% bone building. Only one year treatment.  Cannot have a history of MI in the past year. Can cause hypocalcemia Anabolic agents: PTH hormone. daily SQ dose.  Only use for 2 years Prolia: injection monoclonal antibody, rebound risk for fracture when stopped.  Should stay on this.  Great to stabilize bone  after evenity. SERM: Raloxifene oral pill taken daily. Reduces resorption of bone, lowers risk for breast cancer but has risk of DVT's

## 2023-08-08 DIAGNOSIS — J069 Acute upper respiratory infection, unspecified: Secondary | ICD-10-CM | POA: Diagnosis not present

## 2023-08-08 DIAGNOSIS — R52 Pain, unspecified: Secondary | ICD-10-CM | POA: Diagnosis not present

## 2023-10-27 DIAGNOSIS — F4323 Adjustment disorder with mixed anxiety and depressed mood: Secondary | ICD-10-CM | POA: Diagnosis not present

## 2023-12-16 DIAGNOSIS — Z0184 Encounter for antibody response examination: Secondary | ICD-10-CM | POA: Diagnosis not present

## 2023-12-16 DIAGNOSIS — E782 Mixed hyperlipidemia: Secondary | ICD-10-CM | POA: Diagnosis not present

## 2023-12-16 DIAGNOSIS — Z0000 Encounter for general adult medical examination without abnormal findings: Secondary | ICD-10-CM | POA: Diagnosis not present

## 2023-12-16 DIAGNOSIS — Z136 Encounter for screening for cardiovascular disorders: Secondary | ICD-10-CM | POA: Diagnosis not present

## 2024-01-30 ENCOUNTER — Telehealth: Payer: Self-pay | Admitting: Obstetrics and Gynecology

## 2024-01-30 DIAGNOSIS — E2839 Other primary ovarian failure: Secondary | ICD-10-CM

## 2024-01-30 NOTE — Telephone Encounter (Signed)
 Dxa scan referral

## 2024-01-31 DIAGNOSIS — M542 Cervicalgia: Secondary | ICD-10-CM | POA: Diagnosis not present

## 2024-02-03 DIAGNOSIS — M542 Cervicalgia: Secondary | ICD-10-CM | POA: Diagnosis not present

## 2024-02-08 DIAGNOSIS — M542 Cervicalgia: Secondary | ICD-10-CM | POA: Diagnosis not present

## 2024-02-08 DIAGNOSIS — M5030 Other cervical disc degeneration, unspecified cervical region: Secondary | ICD-10-CM | POA: Diagnosis not present

## 2024-02-10 DIAGNOSIS — M5030 Other cervical disc degeneration, unspecified cervical region: Secondary | ICD-10-CM | POA: Diagnosis not present

## 2024-02-10 DIAGNOSIS — M542 Cervicalgia: Secondary | ICD-10-CM | POA: Diagnosis not present

## 2024-02-24 DIAGNOSIS — M542 Cervicalgia: Secondary | ICD-10-CM | POA: Diagnosis not present

## 2024-02-28 DIAGNOSIS — M542 Cervicalgia: Secondary | ICD-10-CM | POA: Diagnosis not present

## 2024-03-06 DIAGNOSIS — M5030 Other cervical disc degeneration, unspecified cervical region: Secondary | ICD-10-CM | POA: Diagnosis not present

## 2024-03-06 DIAGNOSIS — M542 Cervicalgia: Secondary | ICD-10-CM | POA: Diagnosis not present

## 2024-03-06 DIAGNOSIS — M4722 Other spondylosis with radiculopathy, cervical region: Secondary | ICD-10-CM | POA: Diagnosis not present

## 2024-03-06 DIAGNOSIS — M47812 Spondylosis without myelopathy or radiculopathy, cervical region: Secondary | ICD-10-CM | POA: Diagnosis not present

## 2024-03-22 ENCOUNTER — Ambulatory Visit (HOSPITAL_BASED_OUTPATIENT_CLINIC_OR_DEPARTMENT_OTHER)
Admission: RE | Admit: 2024-03-22 | Discharge: 2024-03-22 | Disposition: A | Discharge status: Home / Self Care | Payer: Self-pay | Source: Ambulatory Visit | Attending: Obstetrics and Gynecology | Admitting: Obstetrics and Gynecology

## 2024-03-22 DIAGNOSIS — E2839 Other primary ovarian failure: Secondary | ICD-10-CM

## 2024-03-27 NOTE — Progress Notes (Signed)
 BMD scheduled for 04/30/2024 at 8 am

## 2024-04-18 DIAGNOSIS — L821 Other seborrheic keratosis: Secondary | ICD-10-CM | POA: Diagnosis not present

## 2024-04-18 DIAGNOSIS — L814 Other melanin hyperpigmentation: Secondary | ICD-10-CM | POA: Diagnosis not present

## 2024-04-18 DIAGNOSIS — D2261 Melanocytic nevi of right upper limb, including shoulder: Secondary | ICD-10-CM | POA: Diagnosis not present

## 2024-04-18 DIAGNOSIS — L570 Actinic keratosis: Secondary | ICD-10-CM | POA: Diagnosis not present

## 2024-04-18 DIAGNOSIS — L720 Epidermal cyst: Secondary | ICD-10-CM | POA: Diagnosis not present

## 2024-04-30 ENCOUNTER — Other Ambulatory Visit (HOSPITAL_BASED_OUTPATIENT_CLINIC_OR_DEPARTMENT_OTHER)

## 2024-05-29 ENCOUNTER — Other Ambulatory Visit (HOSPITAL_BASED_OUTPATIENT_CLINIC_OR_DEPARTMENT_OTHER)

## 2024-06-01 DIAGNOSIS — Z1231 Encounter for screening mammogram for malignant neoplasm of breast: Secondary | ICD-10-CM | POA: Diagnosis not present

## 2024-06-01 LAB — HM MAMMOGRAPHY

## 2024-06-05 ENCOUNTER — Encounter: Payer: Self-pay | Admitting: Obstetrics and Gynecology

## 2024-06-05 ENCOUNTER — Ambulatory Visit: Payer: Self-pay | Admitting: Obstetrics and Gynecology

## 2024-06-28 ENCOUNTER — Ambulatory Visit (HOSPITAL_BASED_OUTPATIENT_CLINIC_OR_DEPARTMENT_OTHER)
Admission: RE | Admit: 2024-06-28 | Discharge: 2024-06-28 | Disposition: A | Discharge status: Home / Self Care | Source: Ambulatory Visit | Attending: Obstetrics and Gynecology | Admitting: Obstetrics and Gynecology

## 2024-06-28 ENCOUNTER — Ambulatory Visit: Payer: Self-pay | Admitting: Obstetrics and Gynecology

## 2024-06-28 DIAGNOSIS — M8589 Other specified disorders of bone density and structure, multiple sites: Secondary | ICD-10-CM | POA: Diagnosis not present

## 2024-06-28 DIAGNOSIS — E2839 Other primary ovarian failure: Secondary | ICD-10-CM | POA: Diagnosis not present

## 2024-06-28 DIAGNOSIS — Z780 Asymptomatic menopausal state: Secondary | ICD-10-CM | POA: Diagnosis not present

## 2024-07-26 ENCOUNTER — Ambulatory Visit (INDEPENDENT_AMBULATORY_CARE_PROVIDER_SITE_OTHER): Admitting: Obstetrics and Gynecology

## 2024-07-26 ENCOUNTER — Encounter: Payer: Self-pay | Admitting: Obstetrics and Gynecology

## 2024-07-26 ENCOUNTER — Other Ambulatory Visit (HOSPITAL_COMMUNITY): Admission: RE | Admit: 2024-07-26 | Source: Ambulatory Visit | Admitting: Obstetrics and Gynecology

## 2024-07-26 VITALS — BP 102/70 | HR 73 | Ht 68.25 in | Wt 165.0 lb

## 2024-07-26 DIAGNOSIS — Z01419 Encounter for gynecological examination (general) (routine) without abnormal findings: Secondary | ICD-10-CM

## 2024-07-26 DIAGNOSIS — N952 Postmenopausal atrophic vaginitis: Secondary | ICD-10-CM

## 2024-07-26 DIAGNOSIS — E2839 Other primary ovarian failure: Secondary | ICD-10-CM

## 2024-07-26 MED ORDER — ESTRADIOL 10 MCG VA TABS: 1.0000 | ORAL_TABLET | VAGINAL | 0 refills | Status: AC

## 2024-07-26 NOTE — Patient Instructions (Addendum)
°  Medications: Bisphosphonate: taken orally on a weekly basis on an empty stomach, side effects reflux and drug inconvenience. Decreases bone resorption but does not build.  Can only take for 3-5 years and you have to take a drug holiday and try another medication.  IV zoledronic acid (reclast): This is given in an IV and is a bisphoshonate once a year for 3 years then take a holiday and start a different medication.  There is about 3% bone building and has benefits on the spine  Evenity injection: 15% bone building. Only one year treatment.  Cannot have a history of MI in the past year. Can cause hypocalcemia Anabolic agents: PTH hormone. daily SQ dose.  Only use for 2 years Prolia: injection monoclonal antibody, rebound risk for fracture when stopped.  Should stay on this.  Great to stabilize bone after evenity. Can lower calcium and vit D levels  SERM: Raloxifene oral pill taken daily. Reduces resorption of bone, lowers risk for breast cancer but has risk of DVT's

## 2024-07-26 NOTE — Progress Notes (Signed)
 63 y.o. y.o. female here for annual exam. Patient's last menstrual period was 06/16/2012.    [id number] Married   RP:  Established patient presenting for annual gyn exam    HPI: Postmenopause, well on no systemic HRT.  No PMB.  No pelvic pain.  Mild vaginal itching currently.  Rarely sexually active.  Improved vaginal dryness with Vagifem  twice a week. No h/o abnormal Pap.  Pap 2023 ASCUS HPV DNA neg.  Pap reflex today.  Urine/BMs normal. Breasts normal. Mammo Neg 10/25 scheduled at [facility].  Osteopenia BD 07/2019 AP spine -2.3. 9/23 -2.4 did not do frax being on vaginal estrogen.  11/25 -2.3 AP spine.  Denies any fractures. Colono 2022.  F[facility]U [facility] here. Patient would like to be proactive about her bone health and is considering treatment Vagifem  needs print out.  There is no height or weight on file to calculate BMI.      No data to display          Last menstrual period 06/16/2012.     Component Value Date/Time   DIAGPAP (A) 04/01/2022 1535    - Atypical squamous cells of undetermined significance (ASC-US )   HPVHIGH Negative 04/01/2022 1535   ADEQPAP  04/01/2022 1535    Satisfactory for evaluation; transformation zone component PRESENT.    GYN HISTORY:    Component Value Date/Time   DIAGPAP (A) 04/01/2022 1535    - Atypical squamous cells of undetermined significance (ASC-US )   HPVHIGH Negative 04/01/2022 1535   ADEQPAP  04/01/2022 1535    Satisfactory for evaluation; transformation zone component PRESENT.    OB History  Gravida Para Term Preterm AB Living  3 2 2  1 2   SAB IAB Ectopic Multiple Live Births  1        # Outcome Date GA Lbr Len/2nd Weight Sex Type Anes PTL Lv  3 SAB           2 Term           1 Term             Past Medical History:  Diagnosis Date   HSV (herpes simplex virus) anogenital infection    Osteopenia 07/2019   T score -2.3 FRAX 7.3% / 0.6%    Past Surgical History:  Procedure Laterality Date   CESAREAN SECTION      TONSILLECTOMY AND ADENOIDECTOMY      Medications Ordered Prior to Encounter[1]  Social History   Socioeconomic History   Marital status: Married    Spouse name: Not on file   Number of children: Not on file   Years of education: Not on file   Highest education level: Not on file  Occupational History   Not on file  Tobacco Use   Smoking status: Never   Smokeless tobacco: Never  Vaping Use   Vaping status: Never Used  Substance and Sexual Activity   Alcohol use: Not Currently    Comment: occ   Drug use: No   Sexual activity: Not Currently    Birth control/protection: Other-see comments, Post-menopausal    Comment: Vasectomy-1st intercourse 63 yo-Fewer than 5 partners  Other Topics Concern   Not on file  Social History Narrative   Not on file   Social Drivers of Health   Tobacco Use: Low Risk (07/26/2023)   Patient History    Smoking Tobacco Use: Never    Smokeless Tobacco Use: Never    Passive Exposure: Not on file  Financial  Resource Strain: Not on file  Food Insecurity: Not on file  Transportation Needs: Not on file  Physical Activity: Not on file  Stress: Not on file  Social Connections: Not on file  Intimate Partner Violence: Not on file  Depression (EYV7-0): Not on file  Alcohol Screen: Not on file  Housing: Not on file  Utilities: Not on file  Health Literacy: Not on file    Family History  Problem Relation Age of Onset   Hypertension Father    Diabetes Father    Cancer Father        Prostate   Cancer Brother        Prostate   Heart failure Mother        CHF     Allergies[2]    Patient's last menstrual period was Patient's last menstrual period was 06/16/2012.SABRA           Review of Systems Alls systems reviewed and are negative.     Physical Exam Constitutional:      Appearance: Normal appearance.  Genitourinary:     Vulva and urethral meatus normal.     No lesions in the vagina.     Right Labia: No rash, lesions or skin  changes.    Left Labia: No lesions, skin changes or rash.    No vaginal discharge or tenderness.     No vaginal prolapse present.    Mild vaginal atrophy present.     Right Adnexa: not tender, not palpable and no mass present.    Left Adnexa: not tender, not palpable and no mass present.    No cervical motion tenderness or discharge.     Uterus is not enlarged, tender or irregular.  Breasts:    Right: Normal.     Left: Normal.  HENT:     Head: Normocephalic.  Neck:     Thyroid : No thyroid  mass, thyromegaly or thyroid  tenderness.  Cardiovascular:     Rate and Rhythm: Normal rate and regular rhythm.     Heart sounds: Normal heart sounds, S1 normal and S2 normal.  Pulmonary:     Effort: Pulmonary effort is normal.     Breath sounds: Normal breath sounds and air entry.  Abdominal:     General: There is no distension.     Palpations: Abdomen is soft. There is no mass.     Tenderness: There is no abdominal tenderness. There is no guarding or rebound.  Musculoskeletal:        General: Normal range of motion.     Cervical back: Full passive range of motion without pain, normal range of motion and neck supple. No tenderness.     Right lower leg: No edema.     Left lower leg: No edema.  Neurological:     Mental Status: She is alert.  Skin:    General: Skin is warm.  Psychiatric:        Mood and Affect: Mood normal.        Behavior: Behavior normal.        Thought Content: Thought content normal.  Vitals and nursing note reviewed. Exam conducted with a chaperone present.       A:         Well Woman GYN exam                             P:        Pap smear collected today  Encouraged annual mammogram screening Colon cancer screening up-to-date DXA up-to-date Labs and immunizations to do with PMD Discussed breast self exams Encouraged healthy lifestyle practices Encouraged Vit D and Calcium, weight bearing exercises  No follow-ups on file.  Almarie MARLA Carpen     [1]   Current Outpatient Medications on File Prior to Visit  Medication Sig Dispense Refill   cycloSPORINE (RESTASIS) 0.05 % ophthalmic emulsion 1 drop 2 (two) times daily.     Estradiol  10 MCG TABS vaginal tablet Place 1 tablet (10 mcg total) vaginally 2 (two) times a week. 90 tablet 0   fexofenadine (ALLEGRA) 180 MG tablet Take 180 mg by mouth daily.     Multiple Vitamin (MULTI VITAMIN) TABS 1 tablet     Omega-3 Fatty Acids (FISH OIL PO) Take by mouth.     VITAMIN D  PO Take by mouth.     No current facility-administered medications on file prior to visit.  [2]  Allergies Allergen Reactions   Macrobid  [Nitrofurantoin  Macrocrystal]     Headache, low-grade temperature, aching   Amoxicillin-Pot Clavulanate Rash   Penicillins Rash    Has patient had a PCN reaction causing immediate rash, facial/tongue/throat swelling, SOB or lightheadedness with hypotension: Yes Has patient had a PCN reaction causing severe rash involving mucus membranes or skin necrosis: No Has patient had a PCN reaction that required hospitalization: No Has patient had a PCN reaction occurring within the last 10 years: No If all of the above answers are NO, then may proceed with Cephalosporin use.

## 2024-08-02 LAB — CYTOLOGY - PAP
Adequacy: ABSENT
Diagnosis: NEGATIVE
Diagnosis: REACTIVE

## 2024-08-03 ENCOUNTER — Ambulatory Visit: Payer: Self-pay | Admitting: Obstetrics and Gynecology

## 2025-07-30 ENCOUNTER — Ambulatory Visit: Admitting: Obstetrics and Gynecology
